# Patient Record
Sex: Female | Born: 1975 | Race: White | Hispanic: No | Marital: Married | Smoking: Never smoker
Health system: Southern US, Community
[De-identification: ages and names within clinical notes are randomized; demographics above are authoritative.]

## PROBLEM LIST (undated history)

## (undated) ENCOUNTER — Inpatient Hospital Stay (HOSPITAL_COMMUNITY): Payer: Self-pay

## (undated) DIAGNOSIS — N390 Urinary tract infection, site not specified: Secondary | ICD-10-CM

## (undated) DIAGNOSIS — N200 Calculus of kidney: Secondary | ICD-10-CM

## (undated) DIAGNOSIS — T8859XA Other complications of anesthesia, initial encounter: Secondary | ICD-10-CM

## (undated) DIAGNOSIS — I10 Essential (primary) hypertension: Secondary | ICD-10-CM

## (undated) DIAGNOSIS — O24419 Gestational diabetes mellitus in pregnancy, unspecified control: Secondary | ICD-10-CM

## (undated) DIAGNOSIS — O139 Gestational [pregnancy-induced] hypertension without significant proteinuria, unspecified trimester: Secondary | ICD-10-CM

## (undated) DIAGNOSIS — K219 Gastro-esophageal reflux disease without esophagitis: Secondary | ICD-10-CM

## (undated) DIAGNOSIS — E119 Type 2 diabetes mellitus without complications: Secondary | ICD-10-CM

## (undated) HISTORY — PX: CHOLECYSTECTOMY: SHX55

## (undated) HISTORY — PX: TONSILLECTOMY: SUR1361

## (undated) HISTORY — PX: KNEE ARTHROSCOPY: SUR90

---

## 1999-07-29 ENCOUNTER — Other Ambulatory Visit: Admission: RE | Admit: 1999-07-29 | Discharge: 1999-07-29 | Payer: Self-pay | Admitting: Gynecology

## 2000-08-28 ENCOUNTER — Other Ambulatory Visit: Admission: RE | Admit: 2000-08-28 | Discharge: 2000-08-28 | Payer: Self-pay | Admitting: Gynecology

## 2000-08-28 ENCOUNTER — Other Ambulatory Visit: Admission: RE | Admit: 2000-08-28 | Discharge: 2000-08-28 | Payer: Self-pay | Admitting: Obstetrics and Gynecology

## 2001-10-05 ENCOUNTER — Other Ambulatory Visit: Admission: RE | Admit: 2001-10-05 | Discharge: 2001-10-05 | Payer: Self-pay | Admitting: Obstetrics and Gynecology

## 2002-02-21 ENCOUNTER — Ambulatory Visit (HOSPITAL_COMMUNITY): Admission: RE | Admit: 2002-02-21 | Discharge: 2002-02-21 | Payer: Self-pay | Admitting: *Deleted

## 2002-10-12 ENCOUNTER — Other Ambulatory Visit: Admission: RE | Admit: 2002-10-12 | Discharge: 2002-10-12 | Payer: Self-pay | Admitting: Obstetrics and Gynecology

## 2002-11-12 ENCOUNTER — Emergency Department (HOSPITAL_COMMUNITY): Admission: EM | Admit: 2002-11-12 | Discharge: 2002-11-12 | Payer: Self-pay | Admitting: Emergency Medicine

## 2002-11-12 ENCOUNTER — Encounter: Payer: Self-pay | Admitting: Emergency Medicine

## 2002-11-18 ENCOUNTER — Encounter: Payer: Self-pay | Admitting: Internal Medicine

## 2002-11-18 ENCOUNTER — Ambulatory Visit (HOSPITAL_COMMUNITY): Admission: RE | Admit: 2002-11-18 | Discharge: 2002-11-18 | Payer: Self-pay | Admitting: Internal Medicine

## 2002-12-16 ENCOUNTER — Emergency Department (HOSPITAL_COMMUNITY): Admission: EM | Admit: 2002-12-16 | Discharge: 2002-12-16 | Payer: Self-pay | Admitting: Emergency Medicine

## 2003-03-01 ENCOUNTER — Ambulatory Visit (HOSPITAL_COMMUNITY): Admission: RE | Admit: 2003-03-01 | Discharge: 2003-03-01 | Payer: Self-pay | Admitting: Internal Medicine

## 2003-03-01 ENCOUNTER — Encounter: Payer: Self-pay | Admitting: Internal Medicine

## 2003-03-28 ENCOUNTER — Ambulatory Visit (HOSPITAL_COMMUNITY): Admission: RE | Admit: 2003-03-28 | Discharge: 2003-03-28 | Payer: Self-pay | Admitting: Family Medicine

## 2003-03-28 ENCOUNTER — Encounter: Payer: Self-pay | Admitting: Family Medicine

## 2003-08-10 ENCOUNTER — Emergency Department (HOSPITAL_COMMUNITY): Admission: EM | Admit: 2003-08-10 | Discharge: 2003-08-11 | Payer: Self-pay | Admitting: *Deleted

## 2003-11-28 ENCOUNTER — Inpatient Hospital Stay: Admission: AD | Admit: 2003-11-28 | Discharge: 2003-11-29 | Payer: Self-pay | Admitting: Obstetrics and Gynecology

## 2004-01-12 ENCOUNTER — Inpatient Hospital Stay (HOSPITAL_COMMUNITY): Admission: AD | Admit: 2004-01-12 | Discharge: 2004-01-14 | Payer: Self-pay | Admitting: Obstetrics and Gynecology

## 2004-02-22 ENCOUNTER — Other Ambulatory Visit: Admission: RE | Admit: 2004-02-22 | Discharge: 2004-02-22 | Payer: Self-pay | Admitting: Obstetrics and Gynecology

## 2004-07-12 ENCOUNTER — Ambulatory Visit (HOSPITAL_COMMUNITY): Admission: RE | Admit: 2004-07-12 | Discharge: 2004-07-12 | Payer: Self-pay | Admitting: Internal Medicine

## 2005-11-27 ENCOUNTER — Encounter (HOSPITAL_COMMUNITY): Admission: RE | Admit: 2005-11-27 | Discharge: 2005-12-27 | Payer: Self-pay | Admitting: Orthopedic Surgery

## 2005-12-30 ENCOUNTER — Encounter (HOSPITAL_COMMUNITY): Admission: RE | Admit: 2005-12-30 | Discharge: 2006-01-29 | Payer: Self-pay | Admitting: Orthopedic Surgery

## 2006-02-13 ENCOUNTER — Ambulatory Visit: Admission: RE | Admit: 2006-02-13 | Discharge: 2006-02-13 | Payer: Self-pay | Admitting: Orthopedic Surgery

## 2006-08-22 ENCOUNTER — Emergency Department (HOSPITAL_COMMUNITY): Admission: EM | Admit: 2006-08-22 | Discharge: 2006-08-22 | Payer: Self-pay | Admitting: Emergency Medicine

## 2006-10-22 ENCOUNTER — Ambulatory Visit (HOSPITAL_COMMUNITY): Admission: RE | Admit: 2006-10-22 | Discharge: 2006-10-22 | Payer: Self-pay | Admitting: Family Medicine

## 2007-03-13 ENCOUNTER — Emergency Department (HOSPITAL_COMMUNITY): Admission: EM | Admit: 2007-03-13 | Discharge: 2007-03-14 | Payer: Self-pay | Admitting: Emergency Medicine

## 2007-03-15 ENCOUNTER — Inpatient Hospital Stay (HOSPITAL_COMMUNITY): Admission: EM | Admit: 2007-03-15 | Discharge: 2007-03-16 | Payer: Self-pay | Admitting: Emergency Medicine

## 2007-03-19 ENCOUNTER — Ambulatory Visit (HOSPITAL_COMMUNITY): Admission: RE | Admit: 2007-03-19 | Discharge: 2007-03-19 | Payer: Self-pay | Admitting: Family Medicine

## 2007-05-25 ENCOUNTER — Ambulatory Visit (HOSPITAL_COMMUNITY): Admission: RE | Admit: 2007-05-25 | Discharge: 2007-05-25 | Payer: Self-pay | Admitting: Obstetrics and Gynecology

## 2007-12-04 ENCOUNTER — Inpatient Hospital Stay (HOSPITAL_COMMUNITY): Admission: AD | Admit: 2007-12-04 | Discharge: 2007-12-04 | Payer: Self-pay | Admitting: Obstetrics and Gynecology

## 2008-08-25 ENCOUNTER — Ambulatory Visit (HOSPITAL_COMMUNITY): Admission: RE | Admit: 2008-08-25 | Discharge: 2008-08-25 | Payer: Self-pay | Admitting: Family Medicine

## 2008-11-09 ENCOUNTER — Inpatient Hospital Stay (HOSPITAL_COMMUNITY): Admission: AD | Admit: 2008-11-09 | Discharge: 2008-11-09 | Payer: Self-pay | Admitting: Obstetrics and Gynecology

## 2008-11-13 ENCOUNTER — Inpatient Hospital Stay (HOSPITAL_COMMUNITY): Admission: AD | Admit: 2008-11-13 | Discharge: 2008-11-13 | Payer: Self-pay | Admitting: Obstetrics and Gynecology

## 2009-01-01 ENCOUNTER — Inpatient Hospital Stay (HOSPITAL_COMMUNITY): Admission: AD | Admit: 2009-01-01 | Discharge: 2009-01-04 | Payer: Self-pay | Admitting: Internal Medicine

## 2009-05-05 ENCOUNTER — Emergency Department (HOSPITAL_COMMUNITY): Admission: EM | Admit: 2009-05-05 | Discharge: 2009-05-06 | Payer: Self-pay | Admitting: Emergency Medicine

## 2010-01-16 IMAGING — US US EXTREM LOW VENOUS*R*
1 series · 14 of 24 positions shown · non-contrast
Comparison: None

CLINICAL DATA: Right upper extremity pain and tenderness, 20 weeks
pregnant

RIGHT UPPER EXTREMITY VENOUS DUPLEX ULTRASOUND
TECHNIQUE: Gray-scale sonography with graded compression, as well
as color Doppler and duplex ultrasound were performed to evaluate
the rightupper extremity deep venous system from the level of the
subclavian vein and including the jugular, axillary, basilic and
upper cephalic vein.  Spectral Doppler was utilized to evaluate
flow at rest and with distal augmentation maneuvers.

[Series 1: us extrem low venous*right* · 0.11mm/px · 14 of 38 slices shown]
[im 1/38]
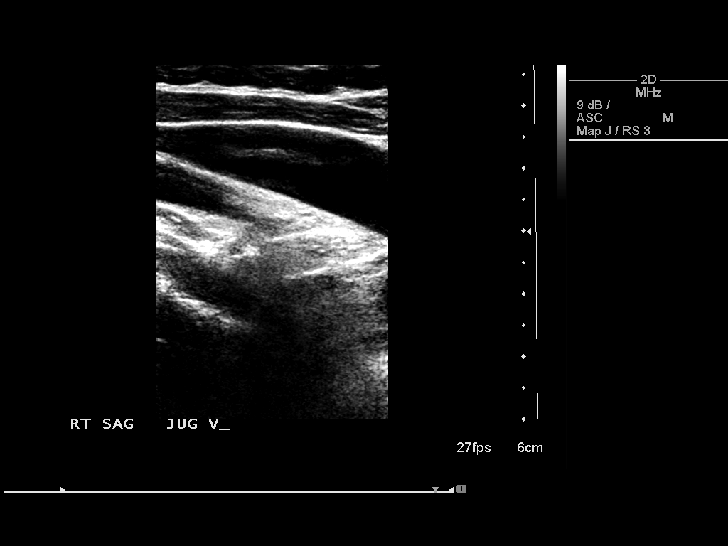
[im 4/38]
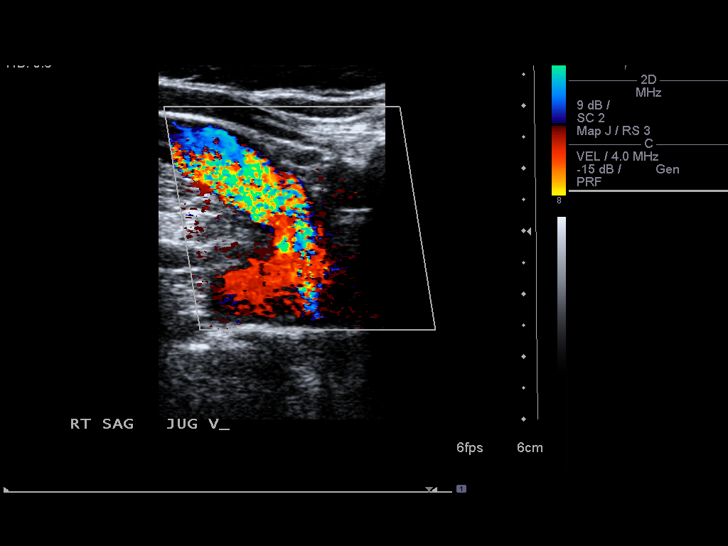
[im 7/38]
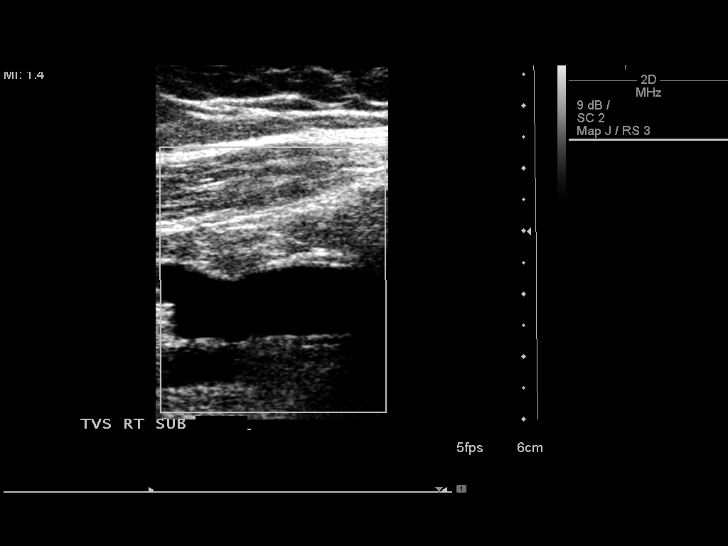
[im 10/38]
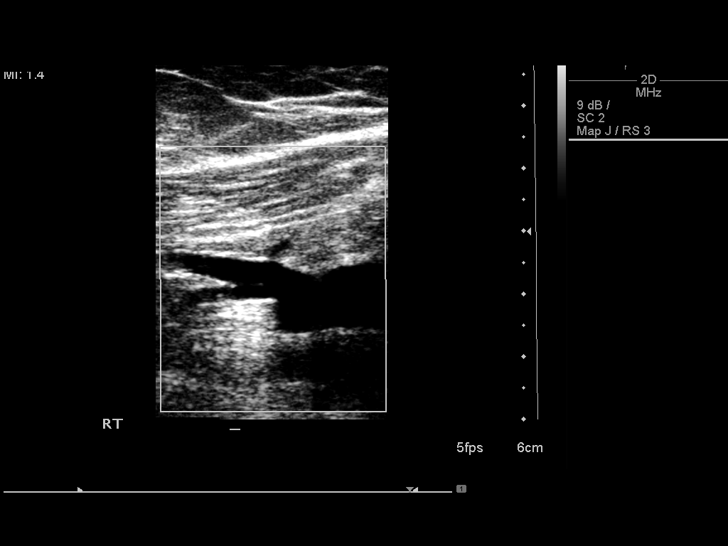
[im 12/38]
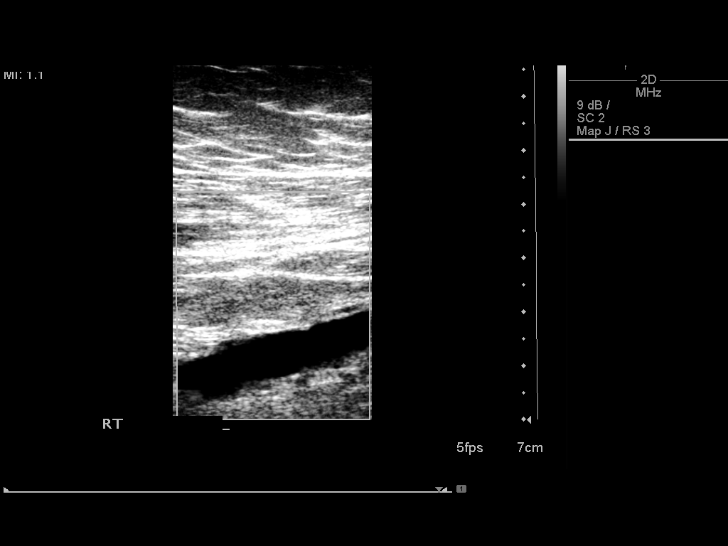
[im 15/38]
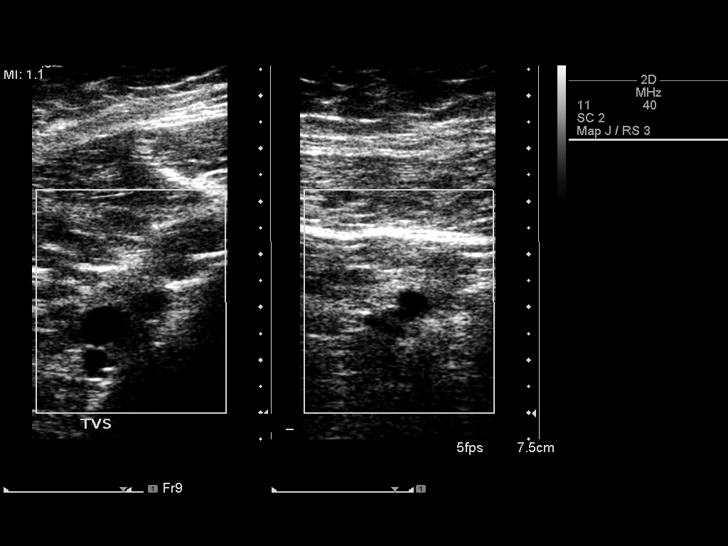
[im 18/38]
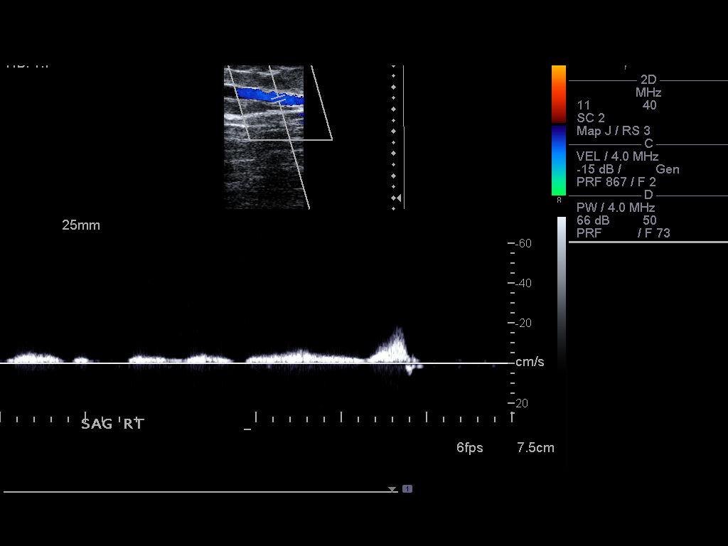
[im 20/38]
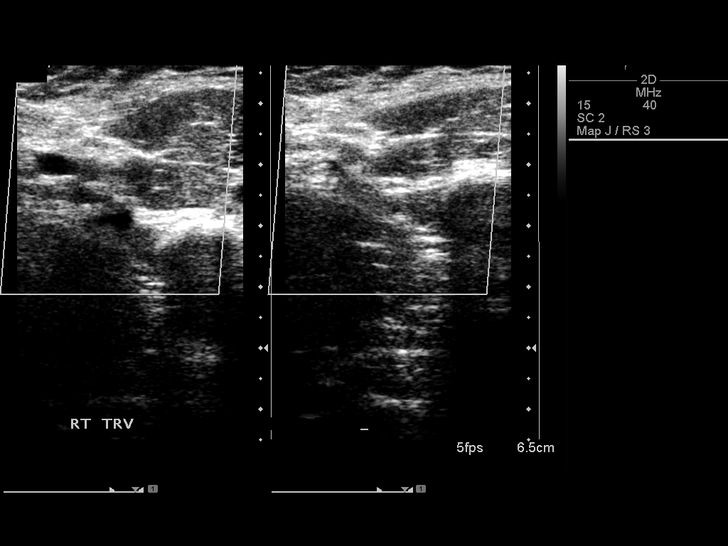
[im 23/38]
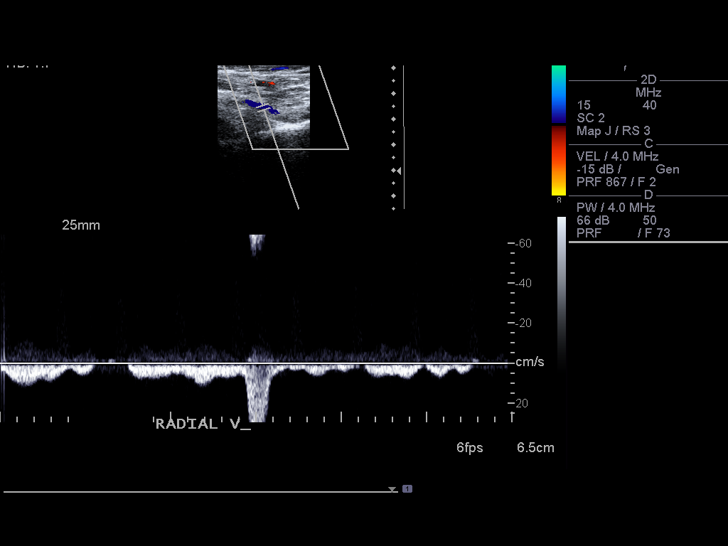
[im 26/38]
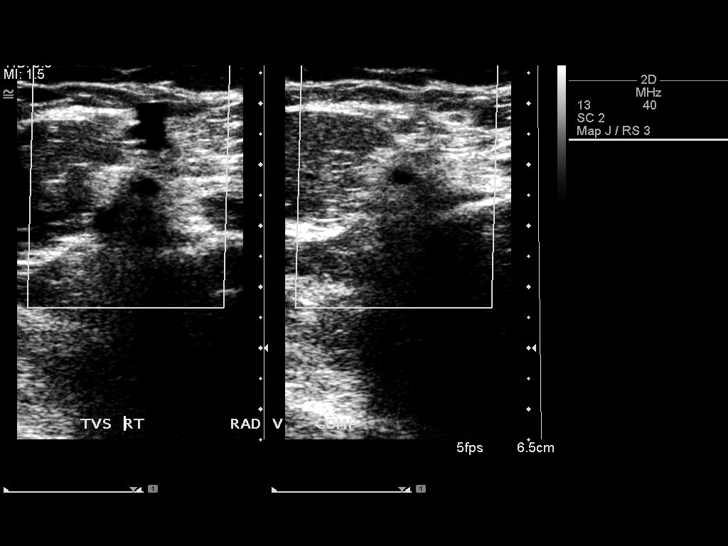
[im 29/38]
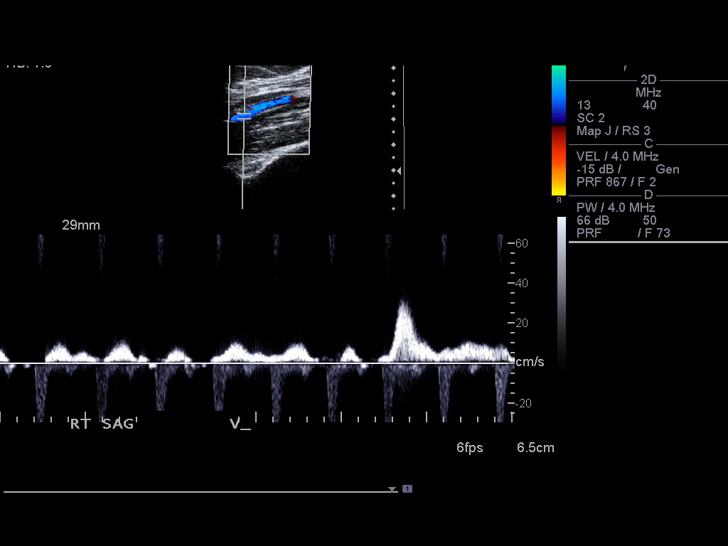
[im 31/38]
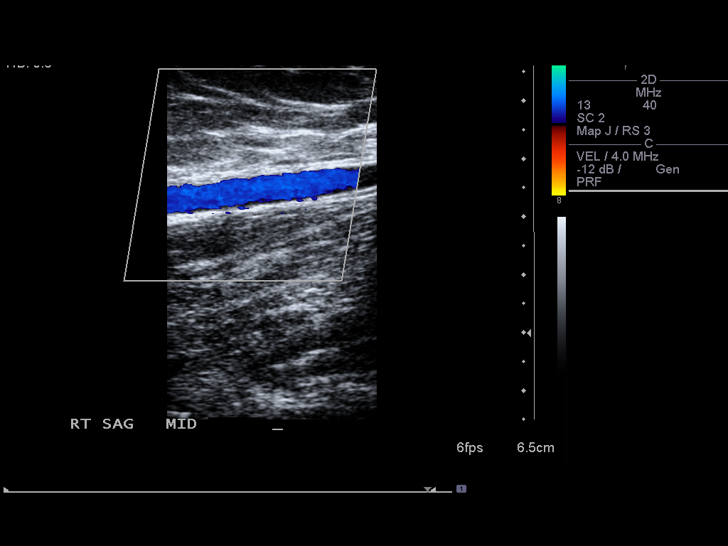
[im 34/38]
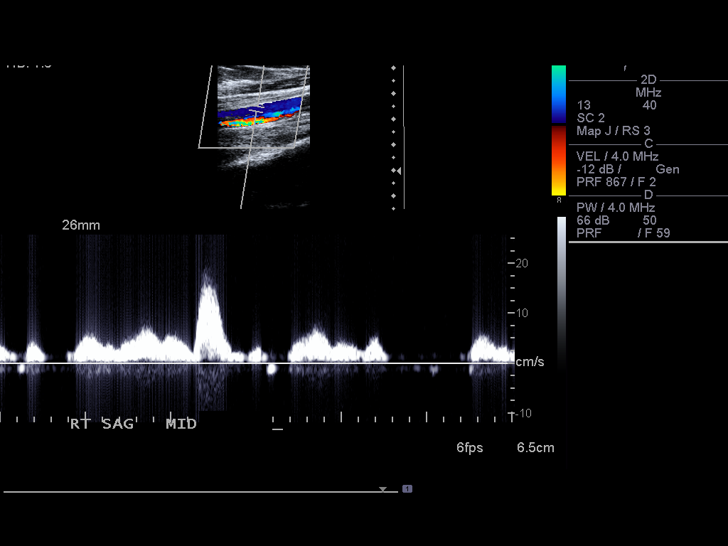
[im 38/38]
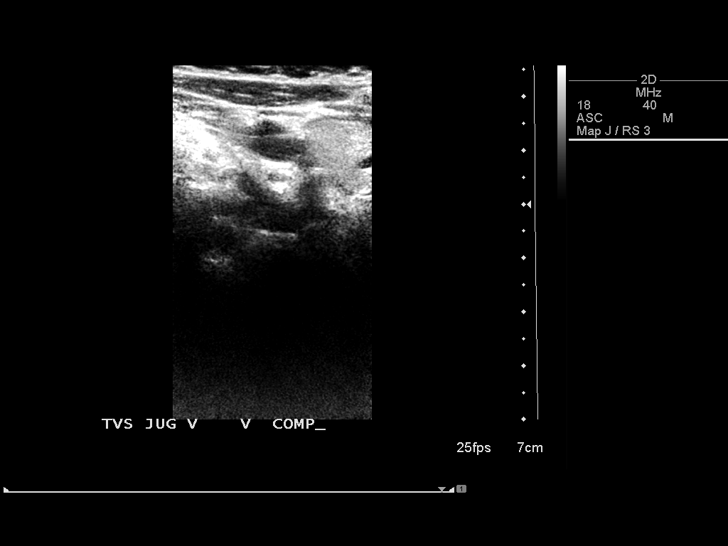

[14 of 24 positions shown; findings below may reference images not displayed]

FINDINGS: Deep venous system of right upper extremity appears patent and
compressible.
Spontaneous venous flow is present with intact augmentation and
respiratory phasicity.
Augmentation intact.
No intraluminal thrombus identified.
IMPRESSION: No evidence of deep venous thrombosis in right upper extremity.

## 2010-10-03 ENCOUNTER — Emergency Department (HOSPITAL_COMMUNITY): Admission: EM | Admit: 2010-10-03 | Discharge: 2010-08-15 | Payer: Self-pay | Admitting: Emergency Medicine

## 2010-11-17 ENCOUNTER — Encounter: Payer: Self-pay | Admitting: Family Medicine

## 2010-11-17 ENCOUNTER — Encounter: Payer: Self-pay | Admitting: Internal Medicine

## 2011-01-08 LAB — BASIC METABOLIC PANEL
Calcium: 9.1 mg/dL (ref 8.4–10.5)
Chloride: 104 mEq/L (ref 96–112)
Creatinine, Ser: 0.47 mg/dL (ref 0.4–1.2)
GFR calc Af Amer: >60 mL/min (ref >60)
Glucose, Bld: 104 mg/dL — ABNORMAL HIGH (ref 70–99)
Potassium: 3.6 mEq/L (ref 3.5–5.1)
Sodium: 136 mEq/L (ref 135–145)

## 2011-01-08 LAB — URINALYSIS, ROUTINE W REFLEX MICROSCOPIC
Leukocytes, UA: NEGATIVE
Nitrite: NEGATIVE
Specific Gravity, Urine: 1.015 (ref 1.005–1.030)
pH: 7 (ref 5.0–8.0)

## 2011-01-08 LAB — CBC
MCV: 87.8 fL (ref 78.0–100.0)
Platelets: 351 10*3/uL (ref 150–400)
RBC: 4.08 MIL/uL (ref 3.87–5.11)
RDW: 12.7 % (ref 11.5–15.5)
WBC: 10.4 10*3/uL (ref 4.0–10.5)

## 2011-01-08 LAB — DIFFERENTIAL
Basophils Absolute: 0.1 10*3/uL (ref 0.0–0.1)
Basophils Relative: 1 % (ref 0–1)
Eosinophils Absolute: 0.2 10*3/uL (ref 0.0–0.7)
Eosinophils Relative: 2 % (ref 0–5)
Neutrophils Relative %: 61 % (ref 43–77)

## 2011-01-08 LAB — URINE MICROSCOPIC-ADD ON

## 2011-01-08 LAB — POCT CARDIAC MARKERS: Myoglobin, poc: 44.1 ng/mL (ref 12–200)

## 2011-02-02 LAB — URINALYSIS, ROUTINE W REFLEX MICROSCOPIC
Glucose, UA: NEGATIVE mg/dL
Leukocytes, UA: NEGATIVE
Nitrite: NEGATIVE
Protein, ur: 100 mg/dL — AB
pH: 7 (ref 5.0–8.0)

## 2011-02-02 LAB — URINE CULTURE

## 2011-02-02 LAB — URINE MICROSCOPIC-ADD ON

## 2011-02-06 LAB — URINE MICROSCOPIC-ADD ON

## 2011-02-06 LAB — URINALYSIS, ROUTINE W REFLEX MICROSCOPIC
Bilirubin Urine: NEGATIVE
Hgb urine dipstick: NEGATIVE
Ketones, ur: NEGATIVE mg/dL
Specific Gravity, Urine: 1.01 (ref 1.005–1.030)

## 2011-02-06 LAB — CBC
MCHC: 33.8 g/dL (ref 30.0–36.0)
MCV: 86.8 fL (ref 78.0–100.0)
MCV: 88.1 fL (ref 78.0–100.0)
Platelets: 200 10*3/uL (ref 150–400)
Platelets: 231 10*3/uL (ref 150–400)
RBC: 4.04 MIL/uL (ref 3.87–5.11)
RDW: 14.1 % (ref 11.5–15.5)
WBC: 11.6 10*3/uL — ABNORMAL HIGH (ref 4.0–10.5)

## 2011-02-06 LAB — COMPREHENSIVE METABOLIC PANEL
ALT: 10 U/L (ref 0–35)
ALT: 15 U/L (ref 0–35)
AST: 19 U/L (ref 0–37)
AST: 20 U/L (ref 0–37)
Albumin: 1.9 g/dL — ABNORMAL LOW (ref 3.5–5.2)
Albumin: 2.4 g/dL — ABNORMAL LOW (ref 3.5–5.2)
Alkaline Phosphatase: 130 U/L — ABNORMAL HIGH (ref 39–117)
Calcium: 9.6 mg/dL (ref 8.4–10.5)
Chloride: 103 mEq/L (ref 96–112)
Chloride: 105 mEq/L (ref 96–112)
Creatinine, Ser: 0.44 mg/dL (ref 0.4–1.2)
Creatinine, Ser: 0.61 mg/dL (ref 0.4–1.2)
GFR calc Af Amer: >60 mL/min (ref >60)
Glucose, Bld: 85 mg/dL (ref 70–99)
Sodium: 134 mEq/L — ABNORMAL LOW (ref 135–145)
Sodium: 137 mEq/L (ref 135–145)
Total Bilirubin: 0.4 mg/dL (ref 0.3–1.2)

## 2011-02-06 LAB — URIC ACID: Uric Acid, Serum: 5.8 mg/dL (ref 2.4–7.0)

## 2011-02-06 LAB — GLUCOSE, CAPILLARY
Glucose-Capillary: 90 mg/dL (ref 70–99)
Glucose-Capillary: 94 mg/dL (ref 70–99)

## 2011-02-06 LAB — RPR: RPR Ser Ql: NONREACTIVE

## 2011-02-10 LAB — URINALYSIS, ROUTINE W REFLEX MICROSCOPIC
Nitrite: NEGATIVE
Specific Gravity, Urine: 1.02 (ref 1.005–1.030)
Urobilinogen, UA: 0.2 mg/dL (ref 0.0–1.0)

## 2011-03-11 NOTE — H&P (Signed)
NAME:  Courtney [name], Courtney [name] NO.:  [id number]   MEDICAL RECORD NO.:  [id number]          PATIENT TYPE:  MAT   LOCATION:  [facility]                          FACILITY:  [facility]   PHYSICIAN:  Duke Salvia. Marcelle Overlie, M.D.DATE OF BIRTH:  1975/11/21   DATE OF ADMISSION:  01/01/2009  DATE OF DISCHARGE:                              HISTORY & PHYSICAL   CHIEF COMPLAINT:  Preeclampsia at term.   HISTORY OF PRESENT ILLNESS:  A 35 year old G12, P1-0-2-1, EDD is January 11, 2009, presents for induction secondary to chronic hypertension with  superimposed preeclampsia.   This patient is blood pressure on booking was 130/84, not on any  medications, but has been 140/80 range in early pregnancy.  Normal  growth by ultrasound.  One 1-hour GTT was 98.  She has been on  gestational diabetes low carb diet with weekly reactive non-stress  tests.   At 31 weeks, she was noted to have a BP 140/90.  This has been followed  with frequent office visits and PIH labs which have been normal.  The  a.m. of admission, was noted to have 1+ protein, BP 152/96, complaining  of a headache, presents now for two-stage labor induction.  Cervix was  noted be 2, 50%, vertex with  NST reactive.  GBS screen was positive   PAST MEDICAL HISTORY:  Please see her health and history form for  details.  First pregnancy was 8 pounds 6 ounces, delivered vaginally in  2005.   PHYSICAL EXAMINATION:  VITAL SIGNS:  Temperature 98.2, blood pressure  150/96, 1+ proteinuria.  HEENT: Unremarkable.  NECK:  Supple without masses.  LUNGS:  Clear.  CARDIOVASCULAR:  Rate and rhythm without murmurs, rubs or gallops noted.  BREASTS:  Not examined.  ABDOMEN:  Term fundal height.  Fetal heart rate 140.  Cervix 2, 50%, vertex, and -2;  membranes intact.  EXTREMITIES:  One to 2+ lower extremity edema.  Reflexes 1 to 2+, no  clonus.   IMPRESSION:  1. A 38-1/2-week intrauterine pregnancy.  2. Gestational diabetes.  3. Chronic hypertension  with superimposed preeclampsia.   PLAN:  Admit for two-stage induction.      Richard M. Marcelle Overlie, M.D.  Electronically Signed     [facility]/MEDQ  D:  01/01/2009  T:  01/01/2009  Job:  [id number]

## 2011-03-11 NOTE — H&P (Signed)
NAMEVENORA, KAUTZMAN              ACCOUNT NO.:  [id number]   MEDICAL RECORD NO.:  [id number]          PATIENT TYPE:  INP   LOCATION:  A303                          FACILITY:  [facility]   PHYSICIAN:  Corrie Mckusick, M.D.  DATE OF BIRTH:  1975-11-25   DATE OF ADMISSION:  03/14/2007  DATE OF DISCHARGE:  [facility]                              HISTORY & PHYSICAL   ADMITTING DIAGNOSES:  1. Diarrhea.  2. Questionable urinary tract infection.   HISTORY OF PRESENTING ILLNESS:  This is a 35 year old with really no  significant past medical history.  She is status post cholecystectomy.  She presents with 2 days of diffuse watery diarrhea and nausea.  She has  had no frank vomiting.  There has been no blood in the stool.  She has  had some intermittent fevers.  She also has had some confusing achy  symptoms.  No urinary symptoms, no dysuria, frequency, urgency or other  complaints.  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 difficile on Friday.  I suspect that this is true/true  and unrelated.  She apparently has no other complaints, except for this  diffuse diarrhea, aches and periumbilical abdominal cramping.   She came in slightly dehydrated 2 days ago to the ER.  They hydrated her  and sent her home.  She came in last night and they decided to keep her  for hydration and further observation.   PAST MEDICAL/SURGICAL HISTORY:  Status post cholecystectomy.   FAMILY HISTORY:  Really noncontributory.   SOCIAL HISTORY:  Neither drinks nor smokes.   PHYSICAL EXAMINATION ON ADMISSION:  VITAL SIGNS:  Temperature 98.9,  blood pressure 126/77, pulse 87, respirations 20.  GENERAL:  When I saw her she was pleasant, talkative and in no acute  distress; feeling quite a bit better.  HEENT:  Negative.  Nasopharynx was clear, with now moist mucous  membranes after hydration.  NECK:  No lymphadenopathy.  CHEST:  Clear to auscultation bilaterally.  CARDIOVASCULAR:  Regular rate  and rhythm, no murmurs.  ABDOMEN:  Bowel sounds are hyperactive.  She has some periumbilical mild  pain with deep palpation.  There is no right upper quadrant pain.  There  is no other lower quadrant pain whatsoever.  No flank pain.  No  guarding.  No masses are appreciated.  EXTREMITIES:  No edema.   LABS:  C. difficile is pending.  Blood cultures are pending.  Urine  culture pending.  CBC:  White count 6.7, hemoglobin 10.6, hematocrit  30.2, platelets 345.  Sodium 136, potassium 3.5, chloride 106, bicarb  25, glucose 133, BUN 4, creatinine 0.56.  Again, C. difficile is  pending.  Urinalysis on admission showed leukocytes and bacteria.   We decided to go ahead and do a CT scan of the abdomen and pelvis.  There are a few upper normal size lymph nodes throughout the mesentery;  otherwise negative CT of the abdomen and pelvis.   ASSESSMENT AND PLAN:  A 35 year old female with no significant past  medical history, with probable viral gastroenteritis, questionable  urinary tract infection.   PLAN:  1. Admit for aggressive hydration with D5 half-normal saline with 10      of potassium at 125 an hour.  2. Cover with Cipro 400 mg IV q12 h.  3. Try to give her clear liquids this morning.  4. Lomotil 2 tablets t.i.d.  5. Will go ahead and cover with doxycycline 100 mg b.i.d., in the      event that this could be tick-borne illness.  I suspect this is      not; she has not found any ticks, but we will go ahead and cover      her for this due to symptomatology.  Will await cultures.      Corrie Mckusick, M.D.  Electronically Signed     JCG/MEDQ  D:  03/15/2007  T:  03/15/2007  Job:  [id number]

## 2011-03-11 NOTE — Discharge Summary (Signed)
NAME:  Courtney [name], Courtney [name] NO.:  [id number]   MEDICAL RECORD NO.:  [id number]          PATIENT TYPE:  INP   LOCATION:  A303                          FACILITY:  [facility]   PHYSICIAN:  Corrie Mckusick, M.D.  DATE OF BIRTH:  1975/12/07   DATE OF ADMISSION:  03/15/2007  DATE OF DISCHARGE:  05/20/2008LH                               DISCHARGE SUMMARY   History of presenting illness and past medical history, please see  admission H and P.   HOSPITAL COURSE:  This is a 35 year old female with no significant past  medical history although status post cholecystectomy, who presents with  2 to 3 days of diarrhea and fever.  She also had some nausea.  She was  exposed to C diff in the office which was negative on stool sample.  Luckily, by putting her in the hospital with aggressive IV fluid  hydration, she improved greatly.  The nausea and diarrhea went away.  She also was noted to be anemic, which she has restarted her menses, and  they have been relatively heavy this week.  She is also getting a workup  as an outpatient by Dr. Sherwood Gambler for the fatigue and anemia.   Iron was 33, which is low.  Percent saturation 12%, which was low as  well.  B12 236, low end of normal.  Hemoglobin was 10.6, hematocrit  30.2.  We decided to start her on iron once her GI symptoms seemed to  improve.  Urine culture is still pending by the time she was discharged.  We will continue her on Cipro.   DISCHARGE PHYSICAL:  Tmax of 98.9, blood pressure 120s/70s, heart rate  in the 60s, respiratory rate 20s.  When I saw her she was pleasant, talkative.  Looks quite well this  morning.  HEENT:  Negative.  Pharynx is clear.  CHEST:  Clear to auscultation bilaterally.  CARDIOVASCULAR:  Regular rate and rhythm.  No murmurs.  ABDOMEN:  Bowel sounds decreased, soft, nontender.   DISCHARGE MEDICATIONS:  Will start on in a week or so and Cipro 500  b.i.d. for an additional week, as well as Zofran 4 mg q.6 hours  p.r.n.  Follow up in 2 to 3 days for recheck, sooner if need be.   DISCHARGE CONDITION:  Improved to stable.      Corrie Mckusick, M.D.  Electronically Signed     JCG/MEDQ  D:  03/16/2007  T:  03/16/2007  Job:  [id number]

## 2011-08-11 LAB — ABO/RH: ABO/RH(D): AB POS

## 2011-11-27 ENCOUNTER — Other Ambulatory Visit (HOSPITAL_COMMUNITY): Payer: Self-pay | Admitting: Physician Assistant

## 2011-11-27 DIAGNOSIS — M5382 Other specified dorsopathies, cervical region: Secondary | ICD-10-CM

## 2011-11-28 ENCOUNTER — Ambulatory Visit (HOSPITAL_COMMUNITY)
Admission: RE | Admit: 2011-11-28 | Discharge: 2011-11-28 | Disposition: A | Discharge status: Home / Self Care | Payer: BC Managed Care – PPO | Source: Ambulatory Visit | Attending: Physician Assistant | Admitting: Physician Assistant

## 2011-11-28 DIAGNOSIS — M5382 Other specified dorsopathies, cervical region: Secondary | ICD-10-CM

## 2011-11-28 DIAGNOSIS — E049 Nontoxic goiter, unspecified: Secondary | ICD-10-CM | POA: Insufficient documentation

## 2011-11-28 DIAGNOSIS — M542 Cervicalgia: Secondary | ICD-10-CM | POA: Insufficient documentation

## 2012-10-24 ENCOUNTER — Emergency Department (HOSPITAL_COMMUNITY)
Admission: EM | Admit: 2012-10-24 | Discharge: 2012-10-24 | Discharge status: Against Medical Advice | Payer: BC Managed Care – PPO | Attending: Emergency Medicine | Admitting: Emergency Medicine

## 2012-10-24 DIAGNOSIS — Z5320 Procedure and treatment not carried out because of patient's decision for unspecified reasons: Secondary | ICD-10-CM | POA: Insufficient documentation

## 2012-10-24 NOTE — ED Notes (Signed)
Pt decided to leave during triage. 

## 2012-12-20 ENCOUNTER — Other Ambulatory Visit (HOSPITAL_COMMUNITY): Payer: Self-pay | Admitting: Family Medicine

## 2012-12-20 ENCOUNTER — Ambulatory Visit (HOSPITAL_COMMUNITY)
Admission: RE | Admit: 2012-12-20 | Discharge: 2012-12-20 | Disposition: A | Discharge status: Home / Self Care | Payer: BC Managed Care – PPO | Source: Ambulatory Visit | Attending: Family Medicine | Admitting: Family Medicine

## 2012-12-20 DIAGNOSIS — D34 Benign neoplasm of thyroid gland: Secondary | ICD-10-CM | POA: Insufficient documentation

## 2012-12-20 DIAGNOSIS — M546 Pain in thoracic spine: Secondary | ICD-10-CM | POA: Insufficient documentation

## 2012-12-20 DIAGNOSIS — R0602 Shortness of breath: Secondary | ICD-10-CM | POA: Insufficient documentation

## 2012-12-20 DIAGNOSIS — R079 Chest pain, unspecified: Secondary | ICD-10-CM | POA: Insufficient documentation

## 2013-06-27 ENCOUNTER — Encounter (HOSPITAL_COMMUNITY): Payer: Self-pay

## 2013-06-27 DIAGNOSIS — I10 Essential (primary) hypertension: Secondary | ICD-10-CM | POA: Insufficient documentation

## 2013-06-27 DIAGNOSIS — M549 Dorsalgia, unspecified: Secondary | ICD-10-CM | POA: Insufficient documentation

## 2013-06-27 DIAGNOSIS — N201 Calculus of ureter: Secondary | ICD-10-CM | POA: Insufficient documentation

## 2013-06-27 DIAGNOSIS — R110 Nausea: Secondary | ICD-10-CM | POA: Insufficient documentation

## 2013-06-27 DIAGNOSIS — Z79899 Other long term (current) drug therapy: Secondary | ICD-10-CM | POA: Insufficient documentation

## 2013-06-27 DIAGNOSIS — Z3202 Encounter for pregnancy test, result negative: Secondary | ICD-10-CM | POA: Insufficient documentation

## 2013-06-27 DIAGNOSIS — Z87440 Personal history of urinary (tract) infections: Secondary | ICD-10-CM | POA: Insufficient documentation

## 2013-06-27 NOTE — ED Notes (Signed)
Pt started having intermittent left flank pain since Wednesday, states is also having some burning with urination

## 2013-06-28 ENCOUNTER — Emergency Department (HOSPITAL_COMMUNITY): Payer: BC Managed Care – PPO

## 2013-06-28 ENCOUNTER — Emergency Department (HOSPITAL_COMMUNITY)
Admission: EM | Admit: 2013-06-28 | Discharge: 2013-06-28 | Disposition: A | Discharge status: Home / Self Care | Payer: BC Managed Care – PPO | Attending: Emergency Medicine | Admitting: Emergency Medicine

## 2013-06-28 DIAGNOSIS — N201 Calculus of ureter: Secondary | ICD-10-CM

## 2013-06-28 HISTORY — DX: Urinary tract infection, site not specified: N39.0

## 2013-06-28 HISTORY — DX: Essential (primary) hypertension: I10

## 2013-06-28 LAB — COMPREHENSIVE METABOLIC PANEL
ALT: 19 U/L (ref 0–35)
AST: 15 U/L (ref 0–37)
BUN: 12 mg/dL (ref 6–23)
GFR calc Af Amer: >90 mL/min (ref >90)
GFR calc non Af Amer: >90 mL/min (ref >90)
Potassium: 3.8 mEq/L (ref 3.5–5.1)
Sodium: 137 mEq/L (ref 135–145)
Total Bilirubin: 0.2 mg/dL — ABNORMAL LOW (ref 0.3–1.2)
Total Protein: 7.2 g/dL (ref 6.0–8.3)

## 2013-06-28 LAB — URINALYSIS, ROUTINE W REFLEX MICROSCOPIC
Ketones, ur: NEGATIVE mg/dL
Leukocytes, UA: NEGATIVE
Nitrite: NEGATIVE
Protein, ur: NEGATIVE mg/dL
Urobilinogen, UA: 0.2 mg/dL (ref 0.0–1.0)

## 2013-06-28 LAB — CBC WITH DIFFERENTIAL/PLATELET
Basophils Relative: 0 % (ref 0–1)
Eosinophils Absolute: 0.4 10*3/uL (ref 0.0–0.7)
MCH: 30.3 pg (ref 26.0–34.0)
MCHC: 34.4 g/dL (ref 30.0–36.0)
Monocytes Relative: 6 % (ref 3–12)
Neutrophils Relative %: 55 % (ref 43–77)
Platelets: 350 10*3/uL (ref 150–400)

## 2013-06-28 LAB — URINE MICROSCOPIC-ADD ON

## 2013-06-28 MED ORDER — SODIUM CHLORIDE 0.9 % IV SOLN: INTRAVENOUS | Status: DC

## 2013-06-28 MED ORDER — IBUPROFEN 800 MG PO TABS: 800.0000 mg | ORAL_TABLET | Freq: Three times a day (TID) | ORAL | Status: DC

## 2013-06-28 MED ORDER — HYDROCODONE-ACETAMINOPHEN 5-325 MG PO TABS: 1.0000 | ORAL_TABLET | Freq: Four times a day (QID) | ORAL | Status: DC | PRN

## 2013-06-28 MED ORDER — ONDANSETRON HCL 4 MG/2ML IJ SOLN
4.0000 mg | Freq: Once | INTRAMUSCULAR | Status: AC
  Administered 2013-06-28: 4 mg via INTRAVENOUS
  Filled 2013-06-28: qty 2

## 2013-06-28 MED ORDER — SODIUM CHLORIDE 0.9 % IV BOLUS (SEPSIS)
500.0000 mL | Freq: Once | INTRAVENOUS | Status: AC
  Administered 2013-06-28: 01:00:00 via INTRAVENOUS

## 2013-06-28 MED ORDER — HYDROMORPHONE HCL PF 1 MG/ML IJ SOLN
1.0000 mg | Freq: Once | INTRAMUSCULAR | Status: AC
  Administered 2013-06-28: 1 mg via INTRAVENOUS
  Filled 2013-06-28: qty 1

## 2013-06-28 NOTE — ED Provider Notes (Signed)
CSN: [id number]     Arrival date & time 06/27/13  2320 History   First MD Initiated Contact with Patient 06/28/13 0029     Chief Complaint  Patient presents with  . Flank Pain   (Consider location/radiation/quality/duration/timing/severity/associated sxs/prior Treatment) Patient is a 37 y.o. female presenting with flank pain. The history is provided by the patient.  Flank Pain Associated symptoms include abdominal pain. Pertinent negatives include no chest pain, no headaches and no shortness of breath.   patient presents with left lower corner abdominal pain present for about a week Kenyen Candy and worse in the last few days. Associated with the left-sided CVA back pain. Also associated with some mild dysuria. Pain is worse is 10 out of 10 associated with nausea no vomiting.  Past Medical History  Diagnosis Date  . Urinary tract infection   . Hypertension    Past Surgical History  Procedure Laterality Date  . Cholecystectomy    . Tonsillectomy    . Knee arthroscopy     No family history on file. History  Substance Use Topics  . Smoking status: Never Smoker   . Smokeless tobacco: Not on file  . Alcohol Use: No   OB History   Grav Para Term Preterm Abortions TAB SAB Ect Mult Living                 Review of Systems  Constitutional: Negative for fever.  HENT: Negative for congestion.   Eyes: Negative for redness.  Respiratory: Negative for shortness of breath.   Cardiovascular: Negative for chest pain.  Gastrointestinal: Positive for nausea and abdominal pain. Negative for vomiting and diarrhea.  Genitourinary: Positive for dysuria and flank pain. Negative for hematuria.  Musculoskeletal: Positive for back pain.  Skin: Negative for rash.  Neurological: Negative for headaches.  Hematological: Does not bruise/bleed easily.  Psychiatric/Behavioral: Negative for confusion.    Allergies  Review of patient's allergies indicates no known allergies.  Home Medications   Current  Outpatient Rx  Name  Route  Sig  Dispense  Refill  . dextromethorphan (DELSYM) 30 MG/5ML liquid   Oral   Take 60 mg by mouth as needed for cough.         Marland Kitchen lisinopril (PRINIVIL,ZESTRIL) 20 MG tablet   Oral   Take 20 mg by mouth daily.         Marland Kitchen HYDROcodone-acetaminophen (NORCO/VICODIN) 5-325 MG per tablet   Oral   Take 1-2 tablets by mouth every 6 (six) hours as needed for pain.   14 tablet   0   . ibuprofen (ADVIL,MOTRIN) 800 MG tablet   Oral   Take 1 tablet (800 mg total) by mouth 3 (three) times daily.   21 tablet   0    BP 135/81  Pulse 72  Temp(Src) 98.4 F (36.9 C) (Oral)  Resp 20  Ht 5' 3.5" (1.613 m)  Wt 226 lb (102.513 kg)  BMI 39.4 kg/m2  SpO2 100% Physical Exam  Nursing note and vitals reviewed. Constitutional: She appears well-developed and well-nourished. No distress.  HENT:  Head: Normocephalic and atraumatic.  Mouth/Throat: Oropharynx is clear and moist.  Eyes: Conjunctivae and EOM are normal. Pupils are equal, round, and reactive to light.  Neck: Normal range of motion.  Cardiovascular: Normal rate, regular rhythm and normal heart sounds.   No murmur heard. Pulmonary/Chest: Effort normal and breath sounds normal. No respiratory distress.  Abdominal: Soft. Bowel sounds are normal. There is no tenderness.  Musculoskeletal: Normal range of motion.  She exhibits no edema.  Neurological: She is alert. No cranial nerve deficit. She exhibits normal muscle tone. Coordination normal.  Skin: Skin is warm. No rash noted.    ED Course  Procedures (including critical care time) Labs Review Labs Reviewed  URINALYSIS, ROUTINE W REFLEX MICROSCOPIC - Abnormal; Notable for the following:    Hgb urine dipstick TRACE (*)    All other components within normal limits  CBC WITH DIFFERENTIAL - Abnormal; Notable for the following:    RBC 3.86 (*)    Hemoglobin 11.7 (*)    HCT 34.0 (*)    All other components within normal limits  COMPREHENSIVE METABOLIC PANEL -  Abnormal; Notable for the following:    Glucose, Bld 111 (*)    Albumin 3.4 (*)    Total Bilirubin 0.2 (*)    All other components within normal limits  URINE MICROSCOPIC-ADD ON - Abnormal; Notable for the following:    Squamous Epithelial / LPF FEW (*)    Bacteria, UA FEW (*)    All other components within normal limits  PREGNANCY, URINE    Results for orders placed during the hospital encounter of 06/28/13  URINALYSIS, ROUTINE W REFLEX MICROSCOPIC      Result Value Range   Color, Urine YELLOW  YELLOW   APPearance CLEAR  CLEAR   Specific Gravity, Urine 1.015  1.005 - 1.030   pH 6.0  5.0 - 8.0   Glucose, UA NEGATIVE  NEGATIVE mg/dL   Hgb urine dipstick TRACE (*) NEGATIVE   Bilirubin Urine NEGATIVE  NEGATIVE   Ketones, ur NEGATIVE  NEGATIVE mg/dL   Protein, ur NEGATIVE  NEGATIVE mg/dL   Urobilinogen, UA 0.2  0.0 - 1.0 mg/dL   Nitrite NEGATIVE  NEGATIVE   Leukocytes, UA NEGATIVE  NEGATIVE  PREGNANCY, URINE      Result Value Range   Preg Test, Ur NEGATIVE  NEGATIVE  CBC WITH DIFFERENTIAL      Result Value Range   WBC 9.1  4.0 - 10.5 K/uL   RBC 3.86 (*) 3.87 - 5.11 MIL/uL   Hemoglobin 11.7 (*) 12.0 - 15.0 g/dL   HCT 47.8 (*) 29.5 - 62.1 %   MCV 88.1  78.0 - 100.0 fL   MCH 30.3  26.0 - 34.0 pg   MCHC 34.4  30.0 - 36.0 g/dL   RDW 30.8  65.7 - 84.6 %   Platelets 350  150 - 400 K/uL   Neutrophils Relative % 55  43 - 77 %   Neutro Abs 5.1  1.7 - 7.7 K/uL   Lymphocytes Relative 34  12 - 46 %   Lymphs Abs 3.1  0.7 - 4.0 K/uL   Monocytes Relative 6  3 - 12 %   Monocytes Absolute 0.5  0.1 - 1.0 K/uL   Eosinophils Relative 5  0 - 5 %   Eosinophils Absolute 0.4  0.0 - 0.7 K/uL   Basophils Relative 0  0 - 1 %   Basophils Absolute 0.0  0.0 - 0.1 K/uL  COMPREHENSIVE METABOLIC PANEL      Result Value Range   Sodium 137  135 - 145 mEq/L   Potassium 3.8  3.5 - 5.1 mEq/L   Chloride 101  96 - 112 mEq/L   CO2 28  19 - 32 mEq/L   Glucose, Bld 111 (*) 70 - 99 mg/dL   BUN 12  6 - 23  mg/dL   Creatinine, Ser 9.62  0.50 - 1.10 mg/dL  Calcium 9.6  8.4 - 10.5 mg/dL   Total Protein 7.2  6.0 - 8.3 g/dL   Albumin 3.4 (*) 3.5 - 5.2 g/dL   AST 15  0 - 37 U/L   ALT 19  0 - 35 U/L   Alkaline Phosphatase 62  39 - 117 U/L   Total Bilirubin 0.2 (*) 0.3 - 1.2 mg/dL   GFR calc non Af Amer >90  >90 mL/min   GFR calc Af Amer >90  >90 mL/min  URINE MICROSCOPIC-ADD ON      Result Value Range   Squamous Epithelial / LPF FEW (*) RARE   WBC, UA 0-2  <3 WBC/hpf   RBC / HPF 0-2  <3 RBC/hpf   Bacteria, UA FEW (*) RARE     Imaging Review Ct Abdomen Pelvis Wo Contrast  06/28/2013   *RADIOLOGY REPORT*  Clinical Data:  Flank pain.  CT ABDOMEN AND PELVIS WITHOUT CONTRAST  Technique:  Multidetector CT imaging of the abdomen and pelvis was performed following the standard protocol without intravenous contrast.  Comparison: 03/15/2007.  Findings:  BODY WALL: Unremarkable.  LOWER CHEST:  Mediastinum: Unremarkable.  Lungs/pleura: No consolidation.  ABDOMEN/PELVIS:  Liver: No focal abnormality.  Biliary: Cholecystectomy, accounting for mild CBD enlargement.  Pancreas: Unremarkable.  Spleen: Unremarkable.  Adrenals: Unremarkable.  Kidneys and ureters: Moderate left hydroureteronephrosis secondary to a 5 mm stone at the left ureterovesicular junction.  Additional, punctate left nephrolithiasis in the lower pole.  Numerous stones in the lower pole right kidney, the largest measuring 6 mm.  No right hydronephrosis.  Bladder: Unremarkable.  Bowel: No obstruction. Normal appendix.  Retroperitoneum: No mass or adenopathy.  Peritoneum: No free fluid or gas.  Reproductive: IUD, unremarkable appearance.  Vascular: No acute abnormality.  OSSEOUS: No acute abnormalities. L4-5 disc bulging.  IMPRESSION:  1.  Left hydroureteronephrosis secondary to a 5 mm stone at the ureterovesicular junction. 2.  Bilateral nephrolithiasis, right more than left.   Original Report Authenticated By: Tiburcio Pea    MDM   1. Ureteral  stone    CT scan correlates with patient's clinical symptoms of left-sided abdominal pain shows a 5 mm stone at the ureterovesicular junction. No evidence of urinary tract infection no evidence of renal function abnormalities. Patient will be treated with the pain medication and anti-inflammatories followup with her primary care Dr. Patient improved in the emergency department with pain meds.    Shelda Jakes, MD 06/28/13 [id number]

## 2014-12-06 ENCOUNTER — Ambulatory Visit (HOSPITAL_COMMUNITY)
Admission: RE | Admit: 2014-12-06 | Discharge: 2014-12-06 | Disposition: A | Discharge status: Home / Self Care | Payer: BLUE CROSS/BLUE SHIELD | Source: Ambulatory Visit | Attending: Internal Medicine | Admitting: Internal Medicine

## 2014-12-06 ENCOUNTER — Other Ambulatory Visit (HOSPITAL_COMMUNITY): Payer: Self-pay | Admitting: Internal Medicine

## 2014-12-06 DIAGNOSIS — E049 Nontoxic goiter, unspecified: Secondary | ICD-10-CM

## 2014-12-06 DIAGNOSIS — R079 Chest pain, unspecified: Secondary | ICD-10-CM

## 2014-12-08 ENCOUNTER — Ambulatory Visit (HOSPITAL_COMMUNITY)
Admission: RE | Admit: 2014-12-08 | Discharge: 2014-12-08 | Disposition: A | Discharge status: Home / Self Care | Payer: BLUE CROSS/BLUE SHIELD | Source: Ambulatory Visit | Attending: Internal Medicine | Admitting: Internal Medicine

## 2014-12-08 DIAGNOSIS — E010 Iodine-deficiency related diffuse (endemic) goiter: Secondary | ICD-10-CM | POA: Insufficient documentation

## 2014-12-08 DIAGNOSIS — E049 Nontoxic goiter, unspecified: Secondary | ICD-10-CM

## 2015-03-09 ENCOUNTER — Other Ambulatory Visit (HOSPITAL_COMMUNITY): Payer: Self-pay | Admitting: Family Medicine

## 2015-03-09 DIAGNOSIS — R1909 Other intra-abdominal and pelvic swelling, mass and lump: Secondary | ICD-10-CM

## 2015-03-12 ENCOUNTER — Ambulatory Visit (HOSPITAL_COMMUNITY): Payer: BLUE CROSS/BLUE SHIELD

## 2015-06-18 ENCOUNTER — Emergency Department (HOSPITAL_COMMUNITY)
Admission: EM | Admit: 2015-06-18 | Discharge: 2015-06-18 | Disposition: A | Discharge status: Home / Self Care | Payer: BLUE CROSS/BLUE SHIELD | Attending: Emergency Medicine | Admitting: Emergency Medicine

## 2015-06-18 ENCOUNTER — Encounter (HOSPITAL_COMMUNITY): Payer: Self-pay | Admitting: Emergency Medicine

## 2015-06-18 ENCOUNTER — Emergency Department (HOSPITAL_COMMUNITY): Payer: BLUE CROSS/BLUE SHIELD

## 2015-06-18 DIAGNOSIS — Z87440 Personal history of urinary (tract) infections: Secondary | ICD-10-CM | POA: Insufficient documentation

## 2015-06-18 DIAGNOSIS — I10 Essential (primary) hypertension: Secondary | ICD-10-CM | POA: Diagnosis not present

## 2015-06-18 DIAGNOSIS — Z3202 Encounter for pregnancy test, result negative: Secondary | ICD-10-CM | POA: Diagnosis not present

## 2015-06-18 DIAGNOSIS — Z79899 Other long term (current) drug therapy: Secondary | ICD-10-CM | POA: Diagnosis not present

## 2015-06-18 DIAGNOSIS — R109 Unspecified abdominal pain: Secondary | ICD-10-CM | POA: Diagnosis present

## 2015-06-18 DIAGNOSIS — N23 Unspecified renal colic: Secondary | ICD-10-CM | POA: Diagnosis not present

## 2015-06-18 LAB — CBC WITH DIFFERENTIAL/PLATELET
BASOS ABS: 0 10*3/uL (ref 0.0–0.1)
Basophils Relative: 0 % (ref 0–1)
Eosinophils Absolute: 0.3 10*3/uL (ref 0.0–0.7)
Eosinophils Relative: 3 % (ref 0–5)
HEMATOCRIT: 33.3 % — AB (ref 36.0–46.0)
Hemoglobin: 11.1 g/dL — ABNORMAL LOW (ref 12.0–15.0)
LYMPHS PCT: 28 % (ref 12–46)
Lymphs Abs: 2.9 10*3/uL (ref 0.7–4.0)
MCH: 29.1 pg (ref 26.0–34.0)
MCHC: 33.3 g/dL (ref 30.0–36.0)
MCV: 87.2 fL (ref 78.0–100.0)
Monocytes Absolute: 0.7 10*3/uL (ref 0.1–1.0)
Monocytes Relative: 7 % (ref 3–12)
NEUTROS ABS: 6.3 10*3/uL (ref 1.7–7.7)
Neutrophils Relative %: 62 % (ref 43–77)
PLATELETS: 333 10*3/uL (ref 150–400)
RBC: 3.82 MIL/uL — ABNORMAL LOW (ref 3.87–5.11)
RDW: 13.4 % (ref 11.5–15.5)
WBC: 10.1 10*3/uL (ref 4.0–10.5)

## 2015-06-18 LAB — URINALYSIS, ROUTINE W REFLEX MICROSCOPIC
Bilirubin Urine: NEGATIVE
Glucose, UA: NEGATIVE mg/dL
Ketones, ur: NEGATIVE mg/dL
Leukocytes, UA: NEGATIVE
Nitrite: NEGATIVE
PH: 6 (ref 5.0–8.0)
PROTEIN: NEGATIVE mg/dL
SPECIFIC GRAVITY, URINE: 1.025 (ref 1.005–1.030)
Urobilinogen, UA: 0.2 mg/dL (ref 0.0–1.0)

## 2015-06-18 LAB — URINE MICROSCOPIC-ADD ON

## 2015-06-18 LAB — COMPREHENSIVE METABOLIC PANEL
ALT: 50 U/L (ref 14–54)
AST: 38 U/L (ref 15–41)
Albumin: 3.5 g/dL (ref 3.5–5.0)
Alkaline Phosphatase: 59 U/L (ref 38–126)
Anion gap: 6 (ref 5–15)
BUN: 14 mg/dL (ref 6–20)
CHLORIDE: 104 mmol/L (ref 101–111)
CO2: 25 mmol/L (ref 22–32)
CREATININE: 0.52 mg/dL (ref 0.44–1.00)
Calcium: 8.4 mg/dL — ABNORMAL LOW (ref 8.9–10.3)
GFR calc Af Amer: >60 mL/min (ref >60)
Glucose, Bld: 117 mg/dL — ABNORMAL HIGH (ref 65–99)
Potassium: 3.4 mmol/L — ABNORMAL LOW (ref 3.5–5.1)
Sodium: 135 mmol/L (ref 135–145)
Total Bilirubin: 0.6 mg/dL (ref 0.3–1.2)
Total Protein: 7.4 g/dL (ref 6.5–8.1)

## 2015-06-18 LAB — PREGNANCY, URINE: Preg Test, Ur: NEGATIVE

## 2015-06-18 MED ORDER — ONDANSETRON HCL 4 MG/2ML IJ SOLN
4.0000 mg | Freq: Once | INTRAMUSCULAR | Status: AC
  Administered 2015-06-18: 4 mg via INTRAVENOUS
  Filled 2015-06-18: qty 2

## 2015-06-18 MED ORDER — HYDROCODONE-ACETAMINOPHEN 5-325 MG PO TABS: 1.0000 | ORAL_TABLET | Freq: Four times a day (QID) | ORAL | Status: DC | PRN

## 2015-06-18 MED ORDER — HYDROMORPHONE HCL 1 MG/ML IJ SOLN
1.0000 mg | Freq: Once | INTRAMUSCULAR | Status: AC
Start: 2015-06-18 — End: 2015-06-18
  Administered 2015-06-18: 1 mg via INTRAVENOUS
  Filled 2015-06-18: qty 1

## 2015-06-18 MED ORDER — HYDROMORPHONE HCL 1 MG/ML IJ SOLN
1.0000 mg | Freq: Once | INTRAMUSCULAR | Status: AC
  Administered 2015-06-18: 1 mg via INTRAVENOUS
  Filled 2015-06-18: qty 1

## 2015-06-18 MED ORDER — KETOROLAC TROMETHAMINE 30 MG/ML IJ SOLN
INTRAMUSCULAR | Status: AC
  Filled 2015-06-18: qty 1

## 2015-06-18 MED ORDER — ONDANSETRON HCL 4 MG PO TABS: 4.0000 mg | ORAL_TABLET | Freq: Three times a day (TID) | ORAL | Status: DC | PRN

## 2015-06-18 MED ORDER — SODIUM CHLORIDE 0.9 % IV BOLUS (SEPSIS)
500.0000 mL | Freq: Once | INTRAVENOUS | Status: AC
  Administered 2015-06-18: 500 mL via INTRAVENOUS

## 2015-06-18 MED ORDER — KETOROLAC TROMETHAMINE 30 MG/ML IJ SOLN
30.0000 mg | Freq: Once | INTRAMUSCULAR | Status: AC
  Administered 2015-06-18: 30 mg via INTRAVENOUS

## 2015-06-18 MED ORDER — IBUPROFEN 600 MG PO TABS: 600.0000 mg | ORAL_TABLET | Freq: Four times a day (QID) | ORAL | Status: DC | PRN

## 2015-06-18 MED ORDER — OXYCODONE-ACETAMINOPHEN 5-325 MG PO TABS: 1.0000 | ORAL_TABLET | ORAL | Status: DC | PRN

## 2015-06-18 MED ORDER — TAMSULOSIN HCL 0.4 MG PO CAPS: 0.4000 mg | ORAL_CAPSULE | Freq: Every day | ORAL | Status: DC

## 2015-06-18 NOTE — ED Notes (Signed)
Patient states woke up this morning with right flank pain radiating around to lower abdomen. Also reports frequent urination and feels as if she is not emptying her bladder.

## 2015-06-18 NOTE — Discharge Instructions (Signed)

## 2015-06-18 NOTE — ED Provider Notes (Signed)
CSN: [id number]     Arrival date & time 06/18/15  0154 History   First MD Initiated Contact with Patient 06/18/15 0204     Chief Complaint  Patient presents with  . Flank Pain     (Consider location/radiation/quality/duration/timing/severity/associated sxs/prior Treatment) HPI Patient has a history of renal stones and presents with acute onset right flank pain radiating to her right lower abdomen. She states symptoms are exactly the same as previous renal stone diagnosed in 2014. She denies any gross hematuria, nausea or vomiting. She's had frequent urination but feels she is not emptying her bladder completely. She's had no fever or chills. Past Medical History  Diagnosis Date  . Urinary tract infection   . Hypertension    Past Surgical History  Procedure Laterality Date  . Cholecystectomy    . Tonsillectomy    . Knee arthroscopy     History reviewed. No pertinent family history. Social History  Substance Use Topics  . Smoking status: Never Smoker   . Smokeless tobacco: None  . Alcohol Use: No   OB History    No data available     Review of Systems  Constitutional: Negative for fever and chills.  Gastrointestinal: Positive for abdominal pain. Negative for nausea and vomiting.  Genitourinary: Positive for frequency and flank pain. Negative for dysuria, hematuria, vaginal bleeding, vaginal discharge and pelvic pain.  Neurological: Negative for dizziness, weakness, light-headedness and numbness.  All other systems reviewed and are negative.     Allergies  Review of patient's allergies indicates no known allergies.  Home Medications   Prior to Admission medications   Medication Sig Start Date End Date Taking? Authorizing Provider  lisinopril (PRINIVIL,ZESTRIL) 20 MG tablet Take 20 mg by mouth daily.   Yes Historical Provider, MD  pantoprazole (PROTONIX) 40 MG tablet Take 40 mg by mouth daily.   Yes Historical Provider, MD  dextromethorphan (DELSYM) 30 MG/5ML liquid  Take 60 mg by mouth as needed for cough.    Historical Provider, MD  HYDROcodone-acetaminophen (NORCO/VICODIN) 5-325 MG per tablet Take 1-2 tablets by mouth every 6 (six) hours as needed. 06/18/15   Julianne Rice, MD  ibuprofen (ADVIL,MOTRIN) 600 MG tablet Take 1 tablet (600 mg total) by mouth every 6 (six) hours as needed for moderate pain. 06/18/15   Julianne Rice, MD  ondansetron (ZOFRAN) 4 MG tablet Take 1 tablet (4 mg total) by mouth every 8 (eight) hours as needed for nausea or vomiting. 06/18/15   Julianne Rice, MD  oxyCODONE-acetaminophen (PERCOCET) 5-325 MG per tablet Take 1-2 tablets by mouth every 4 (four) hours as needed for severe pain. 06/18/15   Julianne Rice, MD  tamsulosin (FLOMAX) 0.4 MG CAPS capsule Take 1 capsule (0.4 mg total) by mouth daily. 06/18/15   Julianne Rice, MD   BP 113/55 mmHg  Pulse 86  Temp(Src) 98 F (36.7 C) (Oral)  Resp 20  Ht 5\' 3"  (1.6 m)  Wt 230 lb (104.327 kg)  BMI 40.75 kg/m2  SpO2 98%  LMP 06/08/2015 Physical Exam  Constitutional: She is oriented to person, place, and time. She appears well-developed and well-nourished. She appears distressed.  Patient is standing bent over in pain.  HENT:  Head: Normocephalic and atraumatic.  Mouth/Throat: Oropharynx is clear and moist.  Eyes: EOM are normal. Pupils are equal, round, and reactive to light.  Neck: Normal range of motion. Neck supple.  Cardiovascular: Normal rate and regular rhythm.   Pulmonary/Chest: Effort normal and breath sounds normal. No respiratory distress. She has  no wheezes. She has no rales.  Abdominal: Soft. Bowel sounds are normal. She exhibits no distension and no mass. There is no tenderness. There is no rebound and no guarding.  Musculoskeletal: Normal range of motion. She exhibits tenderness. She exhibits no edema.  Right CVA tenderness to percussion.  Neurological: She is alert and oriented to person, place, and time.  Skin: Skin is warm and dry. No rash noted. No  erythema.  Psychiatric: She has a normal mood and affect. Her behavior is normal.  Nursing note and vitals reviewed.   ED Course  Procedures (including critical care time) Labs Review Labs Reviewed  CBC WITH DIFFERENTIAL/PLATELET - Abnormal; Notable for the following:    RBC 3.82 (*)    Hemoglobin 11.1 (*)    HCT 33.3 (*)    All other components within normal limits  COMPREHENSIVE METABOLIC PANEL - Abnormal; Notable for the following:    Potassium 3.4 (*)    Glucose, Bld 117 (*)    Calcium 8.4 (*)    All other components within normal limits  URINALYSIS, ROUTINE W REFLEX MICROSCOPIC (NOT AT [facility]) - Abnormal; Notable for the following:    Hgb urine dipstick LARGE (*)    All other components within normal limits  URINE MICROSCOPIC-ADD ON - Abnormal; Notable for the following:    Bacteria, UA MANY (*)    All other components within normal limits  PREGNANCY, URINE    Imaging Review Ct Abdomen Pelvis Wo Contrast  06/18/2015   CLINICAL DATA:  Awoke this morning with sharp constant right flank pain radiating to lower abdomen. Frequent urination.  EXAM: CT ABDOMEN AND PELVIS WITHOUT CONTRAST  TECHNIQUE: Multidetector CT imaging of the abdomen and pelvis was performed following the standard protocol without IV contrast.  COMPARISON:  CT 06/28/2013  FINDINGS: Minimal atelectasis in the dependent right lower lobe. The included lung bases are otherwise clear.  Obstructing 7 mm stone at the right ureteropelvic junction with resultant hydronephrosis. No significant perinephric stranding. The ureter is decompressed. Additional small punctate nonobstructing stones in the lower right kidney. Punctate nonobstructing stone in the upper left kidney, left ureter is decompressed.  Clips in the gallbladder fossa from cholecystectomy. Unchanged prominence of the common bile duct, a common finding postcholecystectomy. No focal hepatic lesion. The spleen, adrenal glands, and pancreas are normal.  Stomach is  physiologically distended. There are no dilated or thickened bowel loops. The appendix is normal. Small to moderate volume of colonic stool without colonic wall thickening. No free air, free fluid, or intra-abdominal fluid collection.  Abdominal aorta is normal in caliber. Multiple small retroperitoneal lymph nodes without pathologic adenopathy.  Urinary bladder is minimally distended. There are bilateral pelvic phleboliths. The uterus is normal for age. The ovaries are symmetric in size. No adnexal mass. No pelvic free fluid.  There are no acute or suspicious osseous abnormalities.  IMPRESSION: 1. Obstructing 7 mm stone at the right ureteropelvic junction with resultant mild hydronephrosis. 2. Small nonobstructing stones in both kidneys.   Electronically Signed   By: Jeb Levering M.D.   On: 06/18/2015 04:03   I have personally reviewed and evaluated these images and lab results as part of my medical decision-making.   EKG Interpretation None      MDM   Final diagnoses:  Renal colic on right side    History and exam consistent with renal stone. Get CT to evaluate for the size and position of the stone.  7 mm stone with mild hydronephrosis. Patient's  symptoms are controlled. We'll discharge home to follow-up with urology. Patient's been given return precautions and has voiced standing.  Julianne Rice, MD 06/18/15 [phone]

## 2015-06-19 ENCOUNTER — Other Ambulatory Visit: Payer: Self-pay | Admitting: Urology

## 2015-06-20 ENCOUNTER — Encounter (HOSPITAL_COMMUNITY): Payer: Self-pay | Admitting: *Deleted

## 2015-06-20 NOTE — H&P (Signed)
Active Problems Problems  1. Calculus of right ureter (N20.1) 2. Microscopic hematuria (R31.2)  History of Present Illness Courtney [name] is a 39 yo WF who had the onset at 1 am of severe right flank pain with nausea and vomiting. She had some urgency on Sunday but no hematuria. She had one prior stone that she passed in 05/2013. She has no other GU history other than a history of a cystoscopy for microhematuria.   Past Medical History Problems  1. History of esophageal reflux (Z87.19) 2. History of hypertension (Z86.79) 3. History of urinary tract infection (Z87.440)  Surgical History Problems  1. History of Arthroscopy Knee 2. History of Cholecystectomy 3. History of Gynecologic Services Intrauterine Device (IUD) Removal 4. History of Tonsillectomy  Current Meds 1. Hydrocodone-Acetaminophen 5-325 MG Oral Tablet;  Therapy: (Recorded:22Aug2016) to Recorded 2. Lisinopril 20 MG Oral Tablet;  Therapy: (Recorded:22Aug2016) to Recorded 3. Ondansetron HCl - 4 MG Oral Tablet;  Therapy: (Recorded:22Aug2016) to Recorded 4. Oxycodone-Acetaminophen 5-325 MG Oral Tablet;  Therapy: (Recorded:22Aug2016) to Recorded 5. Pantoprazole Sodium 20 MG Oral Tablet Delayed Release;  Therapy: (Recorded:22Aug2016) to Recorded  Allergies Medication  1. No Known Drug Allergies  Family History Problems  1. No pertinent family history : Mother, Father  Social History Problems    Denied: History of Alcohol use   Caffeine use (F15.90)   4 per day   Married   Never a smoker   Number of children   2 sons   Occupation   store clerk  G2P2 NVD   Review of Systems Genitourinary, constitutional, skin, eye, otolaryngeal, hematologic/lymphatic, cardiovascular, pulmonary, endocrine, musculoskeletal, gastrointestinal, neurological and psychiatric system(s) were reviewed and pertinent findings if present are noted and are otherwise negative.  Genitourinary: incontinence (with coughing and straining  since her last child was born. ).  Gastrointestinal: nausea, vomiting and flank pain.  Constitutional: fever.  Musculoskeletal: back pain.    Vitals Vital Signs [Data Includes: Last 1 Day]  Recorded: 22Aug2016 04:26PM  Height: 5 ft 3 in Weight: 230 lb  BMI Calculated: 40.74 BSA Calculated: 2.05 Blood Pressure: 123 / 82 Temperature: 98.1 F Heart Rate: 69  Physical Exam Constitutional: Well nourished and well developed . No acute distress.  ENT:. The ears and nose are normal in appearance.  Neck: The appearance of the neck is normal and no neck mass is present.  Pulmonary: No respiratory distress and normal respiratory rhythm and effort.  Cardiovascular: Heart rate and rhythm are normal . No peripheral edema.  Abdomen: The abdomen is obese. No masses are palpated. Mild tenderness in the RUQ is present. mild right CVA tenderness. No hepatosplenomegaly noted.  Lymphatics: The posterior cervical and supraclavicular nodes are not enlarged or tender.  Skin: Normal skin turgor, no visible rash and no visible skin lesions.  Neuro/Psych:. Mood and affect are appropriate.    Results/Data Urine [Data Includes: Last 1 Day]   22Aug2016  COLOR YELLOW   APPEARANCE CLEAR   SPECIFIC GRAVITY 1.025   pH 5.5   GLUCOSE NEGATIVE   BILIRUBIN NEGATIVE   KETONE NEGATIVE   BLOOD 3+   PROTEIN NEGATIVE   NITRITE NEGATIVE   LEUKOCYTE ESTERASE NEGATIVE   SQUAMOUS EPITHELIAL/HPF 6-10 HPF  WBC NONE SEEN WBC/HPF  RBC 20-40 RBC/HPF  BACTERIA MANY HPF  CRYSTALS NONE SEEN HPF  CASTS NONE SEEN LPF  Yeast NONE SEEN HPF   Old records or history reviewed: ER records reviewed.  The following images/tracing/specimen were independently visualized:  CT films reviewed. She has a 7mm   stone with 1300HU density.  The following clinical lab reports were reviewed:  UA reviewed.    Assessment Assessed  1. Calculus of right ureter (N20.1) 2. Microscopic hematuria (R31.2)  She has a 25m right UPJ stone with  symptomatic obstruction.   Plan Calculus of right ureter  1. Follow-up Schedule Surgery Office  Follow-up  Status: Hold For - Appointment   Requested for: 22Aug2016 Health Maintenance  2. UA With REFLEX; [Do Not Release]; Status:Resulted - Requires Verification;   Done:  22Aug2016 04:11PM Microscopic hematuria  3. URINE CULTURE; Status:Hold For - Specimen/Data Collection,Appointment; Requested  for:22Aug2016;  Unlinked  4. Stop: Delsym 30 MG/5ML Oral Suspension Extended Release  Urine culture today.  I have reviewed her options including MET, ureteroscopy and ESWL.  She would like to schedule ESWL on Thursday.   I reviewed the risks of bleeding, infection, injury to the kidney or adjacent organs, need for secondary procedures, failure of fragmentation or obstructing fragments, thrombotic events and sedation complications.   Discussion/Summary CC: Dr. LRedmond School

## 2015-06-21 ENCOUNTER — Ambulatory Visit (HOSPITAL_COMMUNITY)
Admission: RE | Admit: 2015-06-21 | Discharge: 2015-06-21 | Disposition: A | Discharge status: Home / Self Care | Payer: BLUE CROSS/BLUE SHIELD | Source: Ambulatory Visit | Attending: Urology | Admitting: Urology

## 2015-06-21 ENCOUNTER — Encounter (HOSPITAL_COMMUNITY): Payer: Self-pay | Admitting: *Deleted

## 2015-06-21 ENCOUNTER — Ambulatory Visit (HOSPITAL_COMMUNITY): Payer: BLUE CROSS/BLUE SHIELD

## 2015-06-21 ENCOUNTER — Encounter (HOSPITAL_COMMUNITY)
Admission: RE | Disposition: A | Discharge status: Home / Self Care | Payer: Self-pay | Source: Ambulatory Visit | Attending: Urology

## 2015-06-21 DIAGNOSIS — Z79891 Long term (current) use of opiate analgesic: Secondary | ICD-10-CM | POA: Diagnosis not present

## 2015-06-21 DIAGNOSIS — R109 Unspecified abdominal pain: Secondary | ICD-10-CM | POA: Diagnosis present

## 2015-06-21 DIAGNOSIS — N201 Calculus of ureter: Secondary | ICD-10-CM

## 2015-06-21 DIAGNOSIS — Z79899 Other long term (current) drug therapy: Secondary | ICD-10-CM | POA: Diagnosis not present

## 2015-06-21 DIAGNOSIS — Z87442 Personal history of urinary calculi: Secondary | ICD-10-CM | POA: Diagnosis not present

## 2015-06-21 DIAGNOSIS — K219 Gastro-esophageal reflux disease without esophagitis: Secondary | ICD-10-CM | POA: Insufficient documentation

## 2015-06-21 DIAGNOSIS — I10 Essential (primary) hypertension: Secondary | ICD-10-CM | POA: Insufficient documentation

## 2015-06-21 LAB — PREGNANCY, URINE: PREG TEST UR: NEGATIVE

## 2015-06-21 SURGERY — LITHOTRIPSY, ESWL
Anesthesia: LOCAL | Laterality: Right

## 2015-06-21 MED ORDER — CIPROFLOXACIN HCL 500 MG PO TABS
500.0000 mg | ORAL_TABLET | ORAL | Status: AC
  Administered 2015-06-21: 500 mg via ORAL
  Filled 2015-06-21: qty 1

## 2015-06-21 MED ORDER — SODIUM CHLORIDE 0.9 % IV SOLN
INTRAVENOUS | Status: DC
  Administered 2015-06-21: 13:00:00 via INTRAVENOUS

## 2015-06-21 MED ORDER — FENTANYL CITRATE (PF) 100 MCG/2ML IJ SOLN: 25.0000 ug | INTRAMUSCULAR | Status: DC | PRN

## 2015-06-21 MED ORDER — SODIUM CHLORIDE 0.9 % IJ SOLN: 3.0000 mL | Freq: Two times a day (BID) | INTRAMUSCULAR | Status: DC

## 2015-06-21 MED ORDER — ACETAMINOPHEN 325 MG PO TABS: 650.0000 mg | ORAL_TABLET | ORAL | Status: DC | PRN

## 2015-06-21 MED ORDER — ACETAMINOPHEN 650 MG RE SUPP
650.0000 mg | RECTAL | Status: DC | PRN
  Filled 2015-06-21: qty 1

## 2015-06-21 MED ORDER — DIPHENHYDRAMINE HCL 25 MG PO CAPS
25.0000 mg | ORAL_CAPSULE | ORAL | Status: AC
  Administered 2015-06-21: 25 mg via ORAL
  Filled 2015-06-21: qty 1

## 2015-06-21 MED ORDER — DIAZEPAM 5 MG PO TABS
10.0000 mg | ORAL_TABLET | ORAL | Status: AC
  Administered 2015-06-21: 10 mg via ORAL
  Filled 2015-06-21: qty 2

## 2015-06-21 MED ORDER — OXYCODONE-ACETAMINOPHEN 5-325 MG PO TABS: 1.0000 | ORAL_TABLET | ORAL | Status: DC | PRN

## 2015-06-21 MED ORDER — SODIUM CHLORIDE 0.9 % IV SOLN: 250.0000 mL | INTRAVENOUS | Status: DC | PRN

## 2015-06-21 MED ORDER — CIPROFLOXACIN HCL 500 MG PO TABS: 500.0000 mg | ORAL_TABLET | Freq: Two times a day (BID) | ORAL | Status: DC

## 2015-06-21 MED ORDER — OXYCODONE HCL 5 MG PO TABS: 5.0000 mg | ORAL_TABLET | ORAL | Status: DC | PRN

## 2015-06-21 MED ORDER — SODIUM CHLORIDE 0.9 % IJ SOLN: 3.0000 mL | INTRAMUSCULAR | Status: DC | PRN

## 2015-06-21 NOTE — Discharge Instructions (Signed)

## 2015-06-21 NOTE — Interval H&P Note (Signed)
History and Physical Interval Note:  Her culture didn't get sent but she has had no UTI symptoms or fever and she had no WBC on her UA which was nit - and I felt the bacteria were most consistent with vaginal contaminant.   I will send her out with a few extra days of cipro as a precaution.    06/21/2015 1:58 PM  Courtney [name]  has presented today for surgery, with the diagnosis of right UPJ stone  The various methods of treatment have been discussed with the patient and family. After consideration of risks, benefits and other options for treatment, the patient has consented to  Procedure(s): RIGHT EXTRACORPOREAL SHOCK WAVE LITHOTRIPSY (ESWL) (Right) as a surgical intervention .  The patient's history has been reviewed, patient examined, no change in status, stable for surgery.  I have reviewed the patient's chart and labs.  Questions were answered to the patient's satisfaction.     Lonnie Reth J

## 2015-06-27 MED FILL — Hydrocodone-Acetaminophen Tab 5-325 MG: ORAL | Qty: 6 | Status: AC

## 2015-07-06 ENCOUNTER — Ambulatory Visit: Payer: BLUE CROSS/BLUE SHIELD | Admitting: Urology

## 2015-09-26 ENCOUNTER — Emergency Department (HOSPITAL_COMMUNITY): Payer: BLUE CROSS/BLUE SHIELD

## 2015-09-26 ENCOUNTER — Encounter (HOSPITAL_COMMUNITY): Payer: Self-pay | Admitting: Emergency Medicine

## 2015-09-26 ENCOUNTER — Emergency Department (HOSPITAL_COMMUNITY)
Admission: EM | Admit: 2015-09-26 | Discharge: 2015-09-26 | Disposition: A | Discharge status: Home / Self Care | Payer: BLUE CROSS/BLUE SHIELD | Attending: Emergency Medicine | Admitting: Emergency Medicine

## 2015-09-26 DIAGNOSIS — Z792 Long term (current) use of antibiotics: Secondary | ICD-10-CM | POA: Insufficient documentation

## 2015-09-26 DIAGNOSIS — Z79899 Other long term (current) drug therapy: Secondary | ICD-10-CM | POA: Insufficient documentation

## 2015-09-26 DIAGNOSIS — I10 Essential (primary) hypertension: Secondary | ICD-10-CM | POA: Insufficient documentation

## 2015-09-26 DIAGNOSIS — R0789 Other chest pain: Secondary | ICD-10-CM

## 2015-09-26 DIAGNOSIS — M546 Pain in thoracic spine: Secondary | ICD-10-CM | POA: Insufficient documentation

## 2015-09-26 DIAGNOSIS — Z87440 Personal history of urinary (tract) infections: Secondary | ICD-10-CM | POA: Diagnosis not present

## 2015-09-26 DIAGNOSIS — M549 Dorsalgia, unspecified: Secondary | ICD-10-CM

## 2015-09-26 LAB — CBC
HEMATOCRIT: 35 % — AB (ref 36.0–46.0)
Hemoglobin: 11.4 g/dL — ABNORMAL LOW (ref 12.0–15.0)
MCH: 28.5 pg (ref 26.0–34.0)
MCHC: 32.6 g/dL (ref 30.0–36.0)
MCV: 87.5 fL (ref 78.0–100.0)
PLATELETS: 350 10*3/uL (ref 150–400)
RBC: 4 MIL/uL (ref 3.87–5.11)
RDW: 13.4 % (ref 11.5–15.5)
WBC: 8.1 10*3/uL (ref 4.0–10.5)

## 2015-09-26 LAB — I-STAT TROPONIN, ED: TROPONIN I, POC: 0 ng/mL (ref 0.00–0.08)

## 2015-09-26 LAB — BASIC METABOLIC PANEL
Anion gap: 7 (ref 5–15)
BUN: 6 mg/dL (ref 6–20)
CALCIUM: 8.8 mg/dL — AB (ref 8.9–10.3)
CO2: 26 mmol/L (ref 22–32)
CREATININE: 0.55 mg/dL (ref 0.44–1.00)
Chloride: 104 mmol/L (ref 101–111)
GFR calc Af Amer: >60 mL/min (ref >60)
GLUCOSE: 97 mg/dL (ref 65–99)
Potassium: 4 mmol/L (ref 3.5–5.1)
SODIUM: 137 mmol/L (ref 135–145)

## 2015-09-26 NOTE — ED Provider Notes (Signed)
CSN: [id number]     Arrival date & time 09/26/15  1612 History   First MD Initiated Contact with Patient 09/26/15 1902     Chief Complaint  Patient presents with  . Chest Pain  . Back Pain     (Consider location/radiation/quality/duration/timing/severity/associated sxs/prior Treatment) Patient is a 39 y.o. female presenting with chest pain and back pain. The history is provided by the patient.  Chest Pain Associated symptoms: back pain   Associated symptoms: no abdominal pain, no fever, no headache, no palpitations, no shortness of breath and not vomiting   Back Pain Associated symptoms: chest pain   Associated symptoms: no abdominal pain, no fever and no headaches   Patient c/o pain in back for the past week.  Symptoms persistent, at rest, dull, mild, non radiating. No recent injury or strain. No radicular pain. States also felt intermittent indigestion/chest discomfort, also in past week. Hx gerd. Symptoms at rest. Not related to activity or exertion. No associated sob, unusual doe, nv, or diaphoresis. No leg pain or swelling. No recent surgery, immobility, trauma, or travel. No hx dvt or pe. No fam hx premature cad. Father, a smoker, had bypass at age 42.  No pleuritic pain. No abd pain. Hx cholecystectomy. Normal appetite. No wt change.  Denies cough or uri c/o. No fever or chills.      Past Medical History  Diagnosis Date  . Urinary tract infection   . Hypertension    Past Surgical History  Procedure Laterality Date  . Cholecystectomy    . Tonsillectomy    . Knee arthroscopy     No family history on file. Social History  Substance Use Topics  . Smoking status: Never Smoker   . Smokeless tobacco: None  . Alcohol Use: No   OB History    No data available     Review of Systems  Constitutional: Negative for fever and chills.  HENT: Negative for sore throat.   Eyes: Negative for redness.  Respiratory: Negative for shortness of breath.   Cardiovascular: Positive for  chest pain. Negative for palpitations and leg swelling.  Gastrointestinal: Negative for vomiting, abdominal pain and diarrhea.  Genitourinary: Negative for flank pain.  Musculoskeletal: Positive for back pain. Negative for neck pain.  Skin: Negative for rash.  Neurological: Negative for headaches.  Hematological: Does not bruise/bleed easily.  Psychiatric/Behavioral: Negative for confusion.      Allergies  Review of patient's allergies indicates no known allergies.  Home Medications   Prior to Admission medications   Medication Sig Start Date End Date Taking? Authorizing Provider  bisacodyl (DULCOLAX) 5 MG EC tablet Take 5 mg by mouth daily as needed for moderate constipation.    Historical Provider, MD  ciprofloxacin (CIPRO) 500 MG tablet Take 1 tablet (500 mg total) by mouth 2 (two) times daily. 06/21/15   Irine Seal, MD  HYDROcodone-acetaminophen (NORCO/VICODIN) 5-325 MG per tablet Take 1-2 tablets by mouth every 6 (six) hours as needed. 06/18/15   Julianne Rice, MD  ibuprofen (ADVIL,MOTRIN) 600 MG tablet Take 1 tablet (600 mg total) by mouth every 6 (six) hours as needed for moderate pain. Patient not taking: Reported on 06/20/2015 06/18/15   Julianne Rice, MD  lisinopril (PRINIVIL,ZESTRIL) 20 MG tablet Take 20 mg by mouth at bedtime.     Historical Provider, MD  ondansetron (ZOFRAN) 4 MG tablet Take 1 tablet (4 mg total) by mouth every 8 (eight) hours as needed for nausea or vomiting. 06/18/15   Julianne Rice, MD  oxyCODONE-acetaminophen (PERCOCET) 5-325 MG per tablet Take 1 tablet by mouth every 4 (four) hours as needed for severe pain. 06/21/15   Irine Seal, MD  pantoprazole (PROTONIX) 40 MG tablet Take 40 mg by mouth daily.    Historical Provider, MD  tamsulosin (FLOMAX) 0.4 MG CAPS capsule Take 1 capsule (0.4 mg total) by mouth daily. 06/18/15   Julianne Rice, MD   BP 141/81 mmHg  Pulse 68  Temp(Src) 97.9 F (36.6 C) (Oral)  Resp 16  SpO2 97%  LMP 08/28/2015 Physical  Exam  Constitutional: She is oriented to person, place, and time. She appears well-developed and well-nourished. No distress.  HENT:  Mouth/Throat: Oropharynx is clear and moist.  Eyes: Conjunctivae are normal. No scleral icterus.  Neck: Neck supple. No tracheal deviation present.  Cardiovascular: Normal rate, regular rhythm, normal heart sounds and intact distal pulses.  Exam reveals no gallop and no friction rub.   No murmur heard. Pulmonary/Chest: Effort normal and breath sounds normal. No respiratory distress. She exhibits no tenderness.  Abdominal: Soft. Normal appearance and bowel sounds are normal. She exhibits no distension and no mass. There is no tenderness. There is no rebound and no guarding.  Genitourinary:  No cva tenderness.   Musculoskeletal: She exhibits no edema or tenderness.  Neurological: She is alert and oriented to person, place, and time.  Skin: Skin is warm and dry. No rash noted.  Psychiatric: She has a normal mood and affect.  Nursing note and vitals reviewed.   ED Course  Procedures (including critical care time) Labs Review   Results for orders placed or performed during the hospital encounter of [id number]  Basic metabolic panel  Result Value Ref Range   Sodium 137 135 - 145 mmol/L   Potassium 4.0 3.5 - 5.1 mmol/L   Chloride 104 101 - 111 mmol/L   CO2 26 22 - 32 mmol/L   Glucose, Bld 97 65 - 99 mg/dL   BUN 6 6 - 20 mg/dL   Creatinine, Ser 0.55 0.44 - 1.00 mg/dL   Calcium 8.8 (L) 8.9 - 10.3 mg/dL   GFR calc non Af Amer >60 >60 mL/min   GFR calc Af Amer >60 >60 mL/min   Anion gap 7 5 - 15  CBC  Result Value Ref Range   WBC 8.1 4.0 - 10.5 K/uL   RBC 4.00 3.87 - 5.11 MIL/uL   Hemoglobin 11.4 (L) 12.0 - 15.0 g/dL   HCT 35.0 (L) 36.0 - 46.0 %   MCV 87.5 78.0 - 100.0 fL   MCH 28.5 26.0 - 34.0 pg   MCHC 32.6 30.0 - 36.0 g/dL   RDW 13.4 11.5 - 15.5 %   Platelets 350 150 - 400 K/uL  I-stat troponin, ED (not at [facility], [facility])  Result Value Ref Range    Troponin i, poc 0.00 0.00 - 0.08 ng/mL   Comment 3           Dg Chest 2 View  09/26/2015  CLINICAL DATA:  Chest heaviness EXAM: CHEST  2 VIEW COMPARISON:  12/06/2014 FINDINGS: The heart size and mediastinal contours are within normal limits. Both lungs are clear. The visualized skeletal structures are unremarkable. IMPRESSION: No active cardiopulmonary disease. Electronically Signed   By: Marybelle Killings M.D.   On: 09/26/2015 17:05      I have personally reviewed and evaluated these images and lab results as part of my medical decision-making.   EKG Interpretation   Date/Time:  Wednesday September 26 2015 16:19:28 EST  Ventricular Rate:  71 PR Interval:  156 QRS Duration: 82 QT Interval:  396 QTC Calculation: 430 R Axis:   64 Text Interpretation:  Normal sinus rhythm Normal ECG No significant change  since last tracing Confirmed by Ashok Cordia  MD, Lennette Bihari ([id number]) on 09/26/2015  7:03:55 PM      MDM    Labs.  Reviewed nursing notes and prior charts for additional history.   Labs unremarkable.   Pt currently appears stable for d/c.  rec protonix, gi meds if gerd symptoms. For back pain, motrin or aleve. Close pcp f/u.       Lajean Saver, MD 09/26/15 [phone]

## 2015-09-26 NOTE — Discharge Instructions (Signed)
It was our pleasure to provide your ER care today - we hope that you feel better.  Your test results look good.  Continue protonix.  If reflux symptoms, consider also trying maalox, pepcid, or gas-x as need for symptom relief.  For back pain, you may try motrin or aleve as need.  Follow up with primary care doctor in the coming week for recheck.  Return to ER if worse, new symptoms, trouble breathing, fevers, persistent/recurrent chest pain, medical emergency, other concern.      Back Pain, Adult Back pain is very common in adults.The cause of back pain is rarely dangerous and the pain often gets better over time.The cause of your back pain may not be known. Some common causes of back pain include:  Strain of the muscles or ligaments supporting the spine.  Wear and tear (degeneration) of the spinal disks.  Arthritis.  Direct injury to the back. For many people, back pain may return. Since back pain is rarely dangerous, most people can learn to manage this condition on their own. HOME CARE INSTRUCTIONS Watch your back pain for any changes. The following actions may help to lessen any discomfort you are feeling:  Remain active. It is stressful on your back to sit or stand in one place for long periods of time. Do not sit, drive, or stand in one place for more than 30 minutes at a time. Take short walks on even surfaces as soon as you are able.Try to increase the length of time you walk each day.  Exercise regularly as directed by your health care provider. Exercise helps your back heal faster. It also helps avoid future injury by keeping your muscles strong and flexible.  Do not stay in bed.Resting more than 1-2 days can delay your recovery.  Pay attention to your body when you bend and lift. The most comfortable positions are those that put less stress on your recovering back. Always use proper lifting techniques, including:  Bending your knees.  Keeping the load close to  your body.  Avoiding twisting.  Find a comfortable position to sleep. Use a firm mattress and lie on your side with your knees slightly bent. If you lie on your back, put a pillow under your knees.  Avoid feeling anxious or stressed.Stress increases muscle tension and can worsen back pain.It is important to recognize when you are anxious or stressed and learn ways to manage it, such as with exercise.  Take medicines only as directed by your health care provider. Over-the-counter medicines to reduce pain and inflammation are often the most helpful.Your health care provider may prescribe muscle relaxant drugs.These medicines help dull your pain so you can more quickly return to your normal activities and healthy exercise.  Apply ice to the injured area:  Put ice in a plastic bag.  Place a towel between your skin and the bag.  Leave the ice on for 20 minutes, 2-3 times a day for the first 2-3 days. After that, ice and heat may be alternated to reduce pain and spasms.  Maintain a healthy weight. Excess weight puts extra stress on your back and makes it difficult to maintain good posture. SEEK MEDICAL CARE IF:  You have pain that is not relieved with rest or medicine.  You have increasing pain going down into the legs or buttocks.  You have pain that does not improve in one week.  You have night pain.  You lose weight.  You have a fever or chills.  SEEK IMMEDIATE MEDICAL CARE IF:   You develop new bowel or bladder control problems.  You have unusual weakness or numbness in your arms or legs.  You develop nausea or vomiting.  You develop abdominal pain.  You feel faint.   This information is not intended to replace advice given to you by your health care provider. Make sure you discuss any questions you have with your health care provider.   Document Released: 10/13/2005 Document Revised: 11/03/2014 Document Reviewed: 02/14/2014 Elsevier Interactive Patient Education 2016  [employer]    Nonspecific Chest Pain  Chest pain can be caused by many different conditions. There is always a chance that your pain could be related to something serious, such as a heart attack or a blood clot in your lungs. Chest pain can also be caused by conditions that are not life-threatening. If you have chest pain, it is very important to follow up with your health care provider. CAUSES  Chest pain can be caused by:  Heartburn.  Pneumonia or bronchitis.  Anxiety or stress.  Inflammation around your heart (pericarditis) or lung (pleuritis or pleurisy).  A blood clot in your lung.  A collapsed lung (pneumothorax). It can develop suddenly on its own (spontaneous pneumothorax) or from trauma to the chest.  Shingles infection (varicella-zoster virus).  Heart attack.  Damage to the bones, muscles, and cartilage that make up your chest wall. This can include:  Bruised bones due to injury.  Strained muscles or cartilage due to frequent or repeated coughing or overwork.  Fracture to one or more ribs.  Sore cartilage due to inflammation (costochondritis). RISK FACTORS  Risk factors for chest pain may include:  Activities that increase your risk for trauma or injury to your chest.  Respiratory infections or conditions that cause frequent coughing.  Medical conditions or overeating that can cause heartburn.  Heart disease or family history of heart disease.  Conditions or health behaviors that increase your risk of developing a blood clot.  Having had chicken pox (varicella zoster). SIGNS AND SYMPTOMS Chest pain can feel like:  Burning or tingling on the surface of your chest or deep in your chest.  Crushing, pressure, aching, or squeezing pain.  Dull or sharp pain that is worse when you move, cough, or take a deep breath.  Pain that is also felt in your back, neck, shoulder, or arm, or pain that spreads to any of these areas. Your chest pain may come and go,  or it may stay constant. DIAGNOSIS Lab tests or other studies may be needed to find the cause of your pain. Your health care provider may have you take a test called an ambulatory ECG (electrocardiogram). An ECG records your heartbeat patterns at the time the test is performed. You may also have other tests, such as:  Transthoracic echocardiogram (TTE). During echocardiography, sound waves are used to create a picture of all of the heart structures and to look at how blood flows through your heart.  Transesophageal echocardiogram (TEE).This is a more advanced imaging test that obtains images from inside your body. It allows your health care provider to see your heart in finer detail.  Cardiac monitoring. This allows your health care provider to monitor your heart rate and rhythm in real time.  Holter monitor. This is a portable device that records your heartbeat and can help to diagnose abnormal heartbeats. It allows your health care provider to track your heart activity for several days, if needed.  Stress tests. These  can be done through exercise or by taking medicine that makes your heart beat more quickly.  Blood tests.  Imaging tests. TREATMENT  Your treatment depends on what is causing your chest pain. Treatment may include:  Medicines. These may include:  Acid blockers for heartburn.  Anti-inflammatory medicine.  Pain medicine for inflammatory conditions.  Antibiotic medicine, if an infection is present.  Medicines to dissolve blood clots.  Medicines to treat coronary artery disease.  Supportive care for conditions that do not require medicines. This may include:  Resting.  Applying heat or cold packs to injured areas.  Limiting activities until pain decreases. HOME CARE INSTRUCTIONS  If you were prescribed an antibiotic medicine, finish it all even if you start to feel better.  Avoid any activities that bring on chest pain.  Do not use any tobacco products,  including cigarettes, chewing tobacco, or electronic cigarettes. If you need help quitting, ask your health care provider.  Do not drink alcohol.  Take medicines only as directed by your health care provider.  Keep all follow-up visits as directed by your health care provider. This is important. This includes any further testing if your chest pain does not go away.  If heartburn is the cause for your chest pain, you may be told to keep your head raised (elevated) while sleeping. This reduces the chance that acid will go from your stomach into your esophagus.  Make lifestyle changes as directed by your health care provider. These may include:  Getting regular exercise. Ask your health care provider to suggest some activities that are safe for you.  Eating a heart-healthy diet. A registered dietitian can help you to learn healthy eating options.  Maintaining a healthy weight.  Managing diabetes, if necessary.  Reducing stress. SEEK MEDICAL CARE IF:  Your chest pain does not go away after treatment.  You have a rash with blisters on your chest.  You have a fever. SEEK IMMEDIATE MEDICAL CARE IF:   Your chest pain is worse.  You have an increasing cough, or you cough up blood.  You have severe abdominal pain.  You have severe weakness.  You faint.  You have chills.  You have sudden, unexplained chest discomfort.  You have sudden, unexplained discomfort in your arms, back, neck, or jaw.  You have shortness of breath at any time.  You suddenly start to sweat, or your skin gets clammy.  You feel nauseous or you vomit.  You suddenly feel light-headed or dizzy.  Your heart begins to beat quickly, or it feels like it is skipping beats. These symptoms may represent a serious problem that is an emergency. Do not wait to see if the symptoms will go away. Get medical help right away. Call your local emergency services (911 in the [place] Do not drive yourself to the hospital.    This information is not intended to replace advice given to you by your health care provider. Make sure you discuss any questions you have with your health care provider.   Document Released: 07/23/2005 Document Revised: 11/03/2014 Document Reviewed: 05/19/2014 Elsevier Interactive Patient Education 2016 [place].   Gastroesophageal Reflux Disease, Adult Normally, food travels down the esophagus and stays in the stomach to be digested. However, when a person has gastroesophageal reflux disease (GERD), food and stomach acid move back up into the esophagus. When this happens, the esophagus becomes sore and inflamed. Over time, GERD can create small holes (ulcers) in the lining of the esophagus.  CAUSES This  condition is caused by a problem with the muscle between the esophagus and the stomach (lower esophageal sphincter, or LES). Normally, the LES muscle closes after food passes through the esophagus to the stomach. When the LES is weakened or abnormal, it does not close properly, and that allows food and stomach acid to go back up into the esophagus. The LES can be weakened by certain dietary substances, medicines, and medical conditions, including:  Tobacco use.  Pregnancy.  Having a hiatal hernia.  Heavy alcohol use.  Certain foods and beverages, such as coffee, chocolate, onions, and peppermint. RISK FACTORS This condition is more likely to develop in:  People who have an increased body weight.  People who have connective tissue disorders.  People who use NSAID medicines. SYMPTOMS Symptoms of this condition include:  Heartburn.  Difficult or painful swallowing.  The feeling of having a lump in the throat.  Abitter taste in the mouth.  Bad breath.  Having a large amount of saliva.  Having an upset or bloated stomach.  Belching.  Chest pain.  Shortness of breath or wheezing.  Ongoing (chronic) cough or a night-time cough.  Wearing away of tooth  enamel.  Weight loss. Different conditions can cause chest pain. Make sure to see your health care provider if you experience chest pain. DIAGNOSIS Your health care provider will take a medical history and perform a physical exam. To determine if you have mild or severe GERD, your health care provider may also monitor how you respond to treatment. You may also have other tests, including:  An endoscopy toexamine your stomach and esophagus with a small camera.  A test thatmeasures the acidity level in your esophagus.  A test thatmeasures how much pressure is on your esophagus.  A barium swallow or modified barium swallow to show the shape, size, and functioning of your esophagus. TREATMENT The goal of treatment is to help relieve your symptoms and to prevent complications. Treatment for this condition may vary depending on how severe your symptoms are. Your health care provider may recommend:  Changes to your diet.  Medicine.  Surgery. HOME CARE INSTRUCTIONS Diet  Follow a diet as recommended by your health care provider. This may involve avoiding foods and drinks such as:  Coffee and tea (with or without caffeine).  Drinks that containalcohol.  Energy drinks and sports drinks.  Carbonated drinks or sodas.  Chocolate and cocoa.  Peppermint and mint flavorings.  Garlic and onions.  Horseradish.  Spicy and acidic foods, including peppers, chili powder, curry powder, vinegar, hot sauces, and barbecue sauce.  Citrus fruit juices and citrus fruits, such as oranges, lemons, and limes.  Tomato-based foods, such as red sauce, chili, salsa, and pizza with red sauce.  Fried and fatty foods, such as donuts, french fries, potato chips, and high-fat dressings.  High-fat meats, such as hot dogs and fatty cuts of red and white meats, such as rib eye steak, sausage, ham, and bacon.  High-fat dairy items, such as whole milk, butter, and cream cheese.  Eat small, frequent  meals instead of large meals.  Avoid drinking large amounts of liquid with your meals.  Avoid eating meals during the 2-3 hours before bedtime.  Avoid lying down right after you eat.  Do not exercise right after you eat. General Instructions  Pay attention to any changes in your symptoms.  Take over-the-counter and prescription medicines only as told by your health care provider. Do not take aspirin, ibuprofen, or other NSAIDs unless your  health care provider told you to do so.  Do not use any tobacco products, including cigarettes, chewing tobacco, and e-cigarettes. If you need help quitting, ask your health care provider.  Wear loose-fitting clothing. Do not wear anything tight around your waist that causes pressure on your abdomen.  Raise (elevate) the head of your bed 6 inches (15cm).  Try to reduce your stress, such as with yoga or meditation. If you need help reducing stress, ask your health care provider.  If you are overweight, reduce your weight to an amount that is healthy for you. Ask your health care provider for guidance about a safe weight loss goal.  Keep all follow-up visits as told by your health care provider. This is important. SEEK MEDICAL CARE IF:  You have new symptoms.  You have unexplained weight loss.  You have difficulty swallowing, or it hurts to swallow.  You have wheezing or a persistent cough.  Your symptoms do not improve with treatment.  You have a hoarse voice. SEEK IMMEDIATE MEDICAL CARE IF:  You have pain in your arms, neck, jaw, teeth, or back.  You feel sweaty, dizzy, or light-headed.  You have chest pain or shortness of breath.  You vomit and your vomit looks like blood or coffee grounds.  You faint.  Your stool is bloody or black.  You cannot swallow, drink, or eat.   This information is not intended to replace advice given to you by your health care provider. Make sure you discuss any questions you have with your health  care provider.   Document Released: 07/23/2005 Document Revised: 07/04/2015 Document Reviewed: 02/07/2015 Elsevier Interactive Patient Education [employer].

## 2015-09-26 NOTE — ED Notes (Signed)
Per patient, states shes having pain in the middle of her back and shoulder blades x1 week. Today felt heaviness on chest, felt out of breath. Pt states she takes protonix every day, unsure if its heartburn or not. Pt on BP medication as well, pt taking amoxicillin for wisdom teeth, supposed to have those removed soon. Pt is AAOX4, respirations equal and unlabored, in NAD. 5/10 pain in back, pain in chest 4/10.

## 2016-10-24 ENCOUNTER — Encounter (HOSPITAL_COMMUNITY): Payer: Self-pay

## 2016-10-27 NOTE — L&D Delivery Note (Signed)
Delivery Note At 7:14 PM a viable female was delivered via  (Presentation: OA;  ).  APGAR: 8, 9; weight pending.   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  Cord pH: sent  Anesthesia:  cle Episiotomy: None Lacerations: 2nd degree Suture Repair: 3.0 vicryl Est. Blood Loss (mL): 200  Mom to postpartum.  Baby to Couplet care / Skin to Skin. Baby girl with known pelvic cyst - will be for [place] with peds.  Tyson Dense 05/29/2017, 7:38 PM

## 2016-10-31 LAB — OB RESULTS CONSOLE ANTIBODY SCREEN: ANTIBODY SCREEN: NEGATIVE

## 2016-10-31 LAB — OB RESULTS CONSOLE HEPATITIS B SURFACE ANTIGEN: HEP B S AG: NEGATIVE

## 2016-10-31 LAB — OB RESULTS CONSOLE ABO/RH: RH Type: POSITIVE

## 2016-10-31 LAB — OB RESULTS CONSOLE RUBELLA ANTIBODY, IGM: RUBELLA: IMMUNE

## 2016-10-31 LAB — OB RESULTS CONSOLE HIV ANTIBODY (ROUTINE TESTING): HIV: NONREACTIVE

## 2016-10-31 LAB — OB RESULTS CONSOLE RPR: RPR: NONREACTIVE

## 2016-10-31 LAB — OB RESULTS CONSOLE GC/CHLAMYDIA
Chlamydia: NEGATIVE
GC PROBE AMP, GENITAL: NEGATIVE

## 2016-11-16 ENCOUNTER — Inpatient Hospital Stay (HOSPITAL_COMMUNITY)
Admission: AD | Admit: 2016-11-16 | Discharge: 2016-11-17 | Disposition: A | Discharge status: Home / Self Care | Payer: Self-pay | Source: Ambulatory Visit | Attending: Obstetrics and Gynecology | Admitting: Obstetrics and Gynecology

## 2016-11-16 DIAGNOSIS — O26891 Other specified pregnancy related conditions, first trimester: Secondary | ICD-10-CM | POA: Insufficient documentation

## 2016-11-16 DIAGNOSIS — R1084 Generalized abdominal pain: Secondary | ICD-10-CM

## 2016-11-16 DIAGNOSIS — O26899 Other specified pregnancy related conditions, unspecified trimester: Secondary | ICD-10-CM

## 2016-11-16 DIAGNOSIS — R109 Unspecified abdominal pain: Secondary | ICD-10-CM

## 2016-11-16 DIAGNOSIS — M549 Dorsalgia, unspecified: Secondary | ICD-10-CM

## 2016-11-16 DIAGNOSIS — O9989 Other specified diseases and conditions complicating pregnancy, childbirth and the puerperium: Secondary | ICD-10-CM

## 2016-11-16 DIAGNOSIS — Z3A10 10 weeks gestation of pregnancy: Secondary | ICD-10-CM | POA: Insufficient documentation

## 2016-11-16 DIAGNOSIS — M545 Low back pain: Secondary | ICD-10-CM | POA: Insufficient documentation

## 2016-11-16 DIAGNOSIS — O10011 Pre-existing essential hypertension complicating pregnancy, first trimester: Secondary | ICD-10-CM | POA: Insufficient documentation

## 2016-11-16 HISTORY — DX: Gestational (pregnancy-induced) hypertension without significant proteinuria, unspecified trimester: O13.9

## 2016-11-16 HISTORY — DX: Calculus of kidney: N20.0

## 2016-11-16 HISTORY — DX: Gestational diabetes mellitus in pregnancy, unspecified control: O24.419

## 2016-11-17 ENCOUNTER — Encounter (HOSPITAL_COMMUNITY): Payer: Self-pay

## 2016-11-17 ENCOUNTER — Inpatient Hospital Stay (HOSPITAL_COMMUNITY): Payer: Self-pay

## 2016-11-17 DIAGNOSIS — R1084 Generalized abdominal pain: Secondary | ICD-10-CM

## 2016-11-17 LAB — URINALYSIS, ROUTINE W REFLEX MICROSCOPIC
Bilirubin Urine: NEGATIVE
Glucose, UA: NEGATIVE mg/dL
Ketones, ur: NEGATIVE mg/dL
Leukocytes, UA: NEGATIVE
Nitrite: NEGATIVE
PROTEIN: NEGATIVE mg/dL
SPECIFIC GRAVITY, URINE: 1.009 (ref 1.005–1.030)
pH: 6 (ref 5.0–8.0)

## 2016-11-17 LAB — POCT PREGNANCY, URINE: Preg Test, Ur: POSITIVE — AB

## 2016-11-17 NOTE — MAU Provider Note (Signed)
History     CSN: [id number]  Arrival date and time: 11/16/16 2359   None     Chief Complaint  Patient presents with  . Abdominal Pain  . Back Pain   Courtney [name] is a 41 y/o G6P2 with history of recurrent early miscairages who presents today with severe abdominal and back pain. She reports the pain has been present for about 3 or 4 days and has been worse at night. Tonight is the worst. She reports a 7-8 out of 10. She is on vaginal progesterone because of her early misarranges. She has had 2 ultrasounds in the office that have been normal. She had her nurse appointment and all her pre-natal blood work done about 2 wks ago. She was told she had a UTI and given 5 days of antibiotics which she took. She denies any vaginal bleeding or vaginal discharge at this time.     OB History    Gravida Para Term Preterm AB Living   6 2 2   3 2    SAB TAB Ectopic Multiple Live Births   3       2      Past Medical History:  Diagnosis Date  . Gestational diabetes   . Hypertension   . Kidney stones   . Pregnancy induced hypertension   . Urinary tract infection     Past Surgical History:  Procedure Laterality Date  . CHOLECYSTECTOMY    . KNEE ARTHROSCOPY    . TONSILLECTOMY      History reviewed. No pertinent family history.  Social History  Substance Use Topics  . Smoking status: Never Smoker  . Smokeless tobacco: Never Used  . Alcohol use No    Allergies: No Known Allergies  Prescriptions Prior to Admission  Medication Sig Dispense Refill Last Dose  . Prenatal Vit-Fe Fumarate-FA (PRENATAL MULTIVITAMIN) TABS tablet Take 1 tablet by mouth daily at 12 noon.   Past Week at Unknown time  . progesterone 200 MG SUPP Place 200 mg vaginally at bedtime.   11/16/2016 at Unknown time  . bisacodyl (DULCOLAX) 5 MG EC tablet Take 5 mg by mouth daily as needed for moderate constipation.   06/20/2015 at 1400  . ciprofloxacin (CIPRO) 500 MG tablet Take 1 tablet (500 mg total) by mouth 2 (two) times  daily. 6 tablet 0   . HYDROcodone-acetaminophen (NORCO/VICODIN) 5-325 MG per tablet Take 1-2 tablets by mouth every 6 (six) hours as needed. 6 tablet 0 06/20/2015 at 0800  . ibuprofen (ADVIL,MOTRIN) 600 MG tablet Take 1 tablet (600 mg total) by mouth every 6 (six) hours as needed for moderate pain. (Patient not taking: Reported on 06/20/2015) 30 tablet 0   . lisinopril (PRINIVIL,ZESTRIL) 20 MG tablet Take 20 mg by mouth at bedtime.    06/20/2015 at 2200  . ondansetron (ZOFRAN) 4 MG tablet Take 1 tablet (4 mg total) by mouth every 8 (eight) hours as needed for nausea or vomiting. 12 tablet 0 06/20/2015 at 2200  . oxyCODONE-acetaminophen (PERCOCET) 5-325 MG per tablet Take 1 tablet by mouth every 4 (four) hours as needed for severe pain. 15 tablet 0   . pantoprazole (PROTONIX) 40 MG tablet Take 40 mg by mouth daily.   06/20/2015 at 2200  . tamsulosin (FLOMAX) 0.4 MG CAPS capsule Take 1 capsule (0.4 mg total) by mouth daily. 10 capsule 0 06/21/2015 at 0800    Review of Systems  Constitutional: Negative for chills and fever.  Respiratory: Negative for cough and shortness of  breath.   Cardiovascular: Negative for palpitations and leg swelling.  Gastrointestinal: Positive for abdominal pain. Negative for abdominal distention, constipation, diarrhea, nausea and vomiting.  Genitourinary: Negative for difficulty urinating, dysuria, flank pain and frequency.  Neurological: Negative for dizziness and weakness.   Physical Exam   Blood pressure 169/89, pulse 82, temperature 98.4 F (36.9 C), temperature source Oral, resp. rate 18.  Physical Exam  Constitutional: She is oriented to person, place, and time. She appears well-developed and well-nourished.  Cardiovascular: Normal rate and intact distal pulses.   Respiratory: Effort normal. No respiratory distress.  GI: Soft. Bowel sounds are normal. She exhibits no distension. There is no tenderness. There is no rebound and no guarding.  Morbidly obeese   Genitourinary:  Genitourinary Comments: Cervix visually and digitally closed. Health pink mucosa. No vaginal discharge.   Musculoskeletal: Normal range of motion. She exhibits no edema.  mild tenderness to palpation in the back/paraspinal areas.  Neurological: She is alert and oriented to person, place, and time. No cranial nerve deficit.  Skin: Skin is warm and dry. No erythema.  Psychiatric: She has a normal mood and affect. Her behavior is normal.    MAU Course  Procedures  MDM In [place] Patient underwent  Ultrasound which revealed viable pregnancy at 10wks 0 days.   Speculum exam revealed closed cervix with no vaginal discharge.   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 Recheck show some improvement. Patient is a chronic hypertensive and followed by OB.  Assessment and Plan  #1: Low back pain. Unclear etiology. MSK verus early viral infection. Advised supportive care/heating pad tylenol.  Courtney [name] 11/17/2016, 12:50 AM

## 2016-11-17 NOTE — MAU Note (Signed)
Pt c/o lower back pain and abdominal cramping x3-4 days. States that she had a UTI several weeks ago, but never had symptoms-completed antibiotics. Denies urinary s/s at this time. Denies vag bleeding or vag discharge. Denies constipation or diarrhea. Has not taken anything for pain-wanted to "toughen it out".

## 2016-11-17 NOTE — MAU Note (Signed)
Pt presents complaining of back pain and generalized abdominal pain for 3 days. Denies bleeding or leaking. Has not tried pain medication.

## 2016-11-17 NOTE — Discharge Instructions (Signed)
Back Pain in Pregnancy Introduction Back pain during pregnancy is common. Back pain may be caused by several factors that are related to changes during your pregnancy. Follow these instructions at home: Managing pain, stiffness, and swelling  If directed, apply ice for sudden (acute) back pain.  Put ice in a plastic bag.  Place a towel between your skin and the bag.  Leave the ice on for 20 minutes, 2-3 times per day.  If directed, apply heat to the affected area before you exercise:  Place a towel between your skin and the heat pack or heating pad.  Leave the heat on for 20-30 minutes.  Remove the heat if your skin turns bright red. This is especially important if you are unable to feel pain, heat, or cold. You may have a greater risk of getting burned. Activity  Exercise as told by your health care provider. Exercising is the best way to prevent or manage back pain.  Listen to your body when lifting. If lifting hurts, ask for help or bend your knees. This uses your leg muscles instead of your back muscles.  Squat down when picking up something from the floor. Do not bend over.  Only use bed rest as told by your health care provider. Bed rest should only be used for the most severe episodes of back pain. Standing, Sitting, and Lying Down  Do not stand in one place for long periods of time.  Use good posture when sitting. Make sure your head rests over your shoulders and is not hanging forward. Use a pillow on your lower back if necessary.  Try sleeping on your side, preferably the left side, with a pillow or two between your legs. If you are sore after a night's rest, your bed may be too soft. A firm mattress may provide more support for your back during pregnancy. General instructions  Do not wear high heels.  Eat a healthy diet. Try to gain weight within your health care provider's recommendations.  Use a maternity girdle, elastic sling, or back brace as told by your  health care provider.  Take over-the-counter and prescription medicines only as told by your health care provider.  Keep all follow-up visits as told by your health care provider. This is important. This includes any visits with any specialists, such as a physical therapist. Contact a health care provider if:  Your back pain interferes with your daily activities.  You have increasing pain in other parts of your body. Get help right away if:  You develop numbness, tingling, weakness, or problems with the use of your arms or legs.  You develop severe back pain that is not controlled with medicine.  You have a sudden change in bowel or bladder control.  You develop shortness of breath, dizziness, or you faint.  You develop nausea, vomiting, or sweating.  You have back pain that is a rhythmic, cramping pain similar to labor pains. Labor pain is usually 1-2 minutes apart, lasts for about 1 minute, and involves a bearing down feeling or pressure in your pelvis.  You have back pain and your water breaks or you have vaginal bleeding.  You have back pain or numbness that travels down your leg.  Your back pain developed after you fell.  You develop pain on one side of your back.  You see blood in your urine.  You develop skin blisters in the area of your back pain. This information is not intended to replace advice given to   you by your health care provider. Make sure you discuss any questions you have with your health care provider. Document Released: 01/21/2006 Document Revised: 03/20/2016 Document Reviewed: 06/27/2015  2017 Elsevier  

## 2016-11-20 ENCOUNTER — Other Ambulatory Visit: Payer: Self-pay

## 2016-11-21 ENCOUNTER — Other Ambulatory Visit (HOSPITAL_COMMUNITY): Payer: Self-pay | Admitting: Obstetrics and Gynecology

## 2016-11-21 DIAGNOSIS — Z3A18 18 weeks gestation of pregnancy: Secondary | ICD-10-CM

## 2016-11-21 DIAGNOSIS — O09522 Supervision of elderly multigravida, second trimester: Secondary | ICD-10-CM

## 2016-11-21 DIAGNOSIS — Z3689 Encounter for other specified antenatal screening: Secondary | ICD-10-CM

## 2016-11-21 DIAGNOSIS — O09891 Supervision of other high risk pregnancies, first trimester: Secondary | ICD-10-CM

## 2016-11-21 DIAGNOSIS — O10919 Unspecified pre-existing hypertension complicating pregnancy, unspecified trimester: Secondary | ICD-10-CM

## 2017-01-13 ENCOUNTER — Encounter (HOSPITAL_COMMUNITY): Payer: Self-pay | Admitting: Obstetrics and Gynecology

## 2017-01-16 ENCOUNTER — Ambulatory Visit (HOSPITAL_COMMUNITY): Payer: Self-pay

## 2017-01-20 ENCOUNTER — Ambulatory Visit (HOSPITAL_COMMUNITY)
Admission: RE | Admit: 2017-01-20 | Discharge: 2017-01-20 | Disposition: A | Discharge status: Home / Self Care | Payer: 59 | Source: Ambulatory Visit | Attending: Obstetrics and Gynecology | Admitting: Obstetrics and Gynecology

## 2017-01-20 ENCOUNTER — Encounter (HOSPITAL_COMMUNITY): Payer: Self-pay

## 2017-01-20 ENCOUNTER — Other Ambulatory Visit (HOSPITAL_COMMUNITY): Payer: Self-pay | Admitting: *Deleted

## 2017-01-20 DIAGNOSIS — O10919 Unspecified pre-existing hypertension complicating pregnancy, unspecified trimester: Secondary | ICD-10-CM

## 2017-01-20 DIAGNOSIS — O09522 Supervision of elderly multigravida, second trimester: Secondary | ICD-10-CM | POA: Diagnosis not present

## 2017-01-20 DIAGNOSIS — Z3689 Encounter for other specified antenatal screening: Secondary | ICD-10-CM

## 2017-01-20 DIAGNOSIS — Z363 Encounter for antenatal screening for malformations: Secondary | ICD-10-CM | POA: Insufficient documentation

## 2017-01-20 DIAGNOSIS — Z3A19 19 weeks gestation of pregnancy: Secondary | ICD-10-CM | POA: Insufficient documentation

## 2017-01-20 DIAGNOSIS — O99212 Obesity complicating pregnancy, second trimester: Secondary | ICD-10-CM | POA: Diagnosis not present

## 2017-01-20 DIAGNOSIS — O09292 Supervision of pregnancy with other poor reproductive or obstetric history, second trimester: Secondary | ICD-10-CM | POA: Diagnosis not present

## 2017-01-20 DIAGNOSIS — O09891 Supervision of other high risk pregnancies, first trimester: Secondary | ICD-10-CM

## 2017-01-20 DIAGNOSIS — O352XX1 Maternal care for (suspected) hereditary disease in fetus, fetus 1: Secondary | ICD-10-CM

## 2017-01-20 DIAGNOSIS — O10012 Pre-existing essential hypertension complicating pregnancy, second trimester: Secondary | ICD-10-CM | POA: Diagnosis not present

## 2017-01-20 DIAGNOSIS — Q270 Congenital absence and hypoplasia of umbilical artery: Secondary | ICD-10-CM

## 2017-01-20 DIAGNOSIS — Z3A18 18 weeks gestation of pregnancy: Secondary | ICD-10-CM

## 2017-01-20 NOTE — Progress Notes (Signed)
Genetic Counseling  High-Risk Gestation Note  Appointment Date:  01/20/2017 Referred By: Courtney Farrier, MD Date of Birth:  08-Mar-1976 Partner:  Courtney [name]   Pregnancy History: [id number] Estimated Date of Delivery: 06/14/17 Estimated Gestational Age: [redacted]w[redacted]d Attending: Benjaman Lobe, MD   Mrs. Courtney [name] and her husband, Mr. Courtney [name], were seen for genetic counseling because of a maternal age of 25 and family history concerns.  This couple's 40 year old son also attended the genetic counseling visit today.  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   We reviewed chromosomes, nondisjunction, and the associated 1 in 1 risk for fetal aneuploidy related to a maternal age of 4 at [redacted]w[redacted]d gestation. They were counseled that the risk for aneuploidy decreases as gestational age increases, accounting for those pregnancies which spontaneously abort.  We specifically discussed Down syndrome (trisomy 28), trisomies 33 and 96, and sex chromosome aneuploidies (47,XXX and 47,XXY) including the common features and prognoses of each.   We also reviewed Courtney [name]'s noninvasive prenatal screening (NIPS) result. Specifically,  Courtney [name] had Panorama testing performed at her obstetrician's office. We reviewed that the results from this testing were low risk, indicating that the chance for specific fetal aneuploidy was reduced to less than 1 in 10,000. We reviewed that NIPS analyzes specific placental DNA in maternal circulation. NIPS is considered to be highly specific and sensitive, but is not considered to be a diagnostic test. We reviewed that this testing identifies the majority of pregnancies with trisomies 21, 13, and 18, as well as specific sex chromosome conditions including: 47,XXX, 47,XXY, and 47,XYY.     We reviewed other available options including detailed ultrasound and amniocentesis. We reviewed the approximate 1 in 193 risk for complications for amniocentesis, including spontaneous pregnancy loss. This couple understands that given the normal NIPS result, the risk of fetal aneuploidy is much lower than the risk of a complication related to diagnostic invasive testing. After consideration of these options, they elected to have a detailed ultrasound, but declined amniocentesis. A complete ultrasound was performed today. The ultrasound report will be documented separately. 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 They were counseled that SUA occurs in approximately 1 in every 200 pregnancies and is defined as the presence of one umbilical artery and one umbilical vein, instead of the typical three vessel cord containing two arteries and one vein.  We discussed that the finding of an isolated SUA (no other markers or anomalies visualized) is not thought to increase the risk for fetal aneuploidy. However, the literature is conflicting regarding an association between this finding and congenital heart defects.  In addition, we discussed that SUA is known to be associated with an increased risk for fetal growth restriction.  We reviewed the option of serial ultrasound to monitor fetal growth.  Both family histories were  reviewed and found to be contributory for this couple having a son who has 47,XYY. We reviewed that this condition is caused by paternal nondisjunction during meiosis II, resulting in a female having an extra Y chromosome. We reviewed the common features of this condition and available screening and diagnostic testing options. Specifically, we discussed that NIPS (Panorama) screens for the presence of extra and missing sex chromosomes with high accuracy. They understand that fetuses with 47,XYY typically do not have major physical differences, and as such, a normal ultrasound does not adjust a couple's a priori risk of this condition. Given that 47,XYY occurs due to paternal nondisjunction, the risk of recurrence is not expected to be increased for this or future pregnancies. The overall risk for fetal aneuploidy would be directly related to the maternal age.  In addition, this couple's son with 48,XYY also has a rare form of epilepsy called Lennox-Gastaut syndrome (LGS). We reviewed that this is a rare type of epilepsy characterized by multiple different types of seizures and intellectual disability. The condition is genetically heterogeneous and only 1 in 4 individuals with LGS have a detectable cause. We discussed that some genetic studies have found specific alterations within specific genes, which may contribute to the development of LGS in some individuals. There are also others causes such as brain injuries (infections, birth trauma, frontal [name] damage). Courtney [name] reported that he is a carrier of a specific gene change, which might increase the likelihood of LGS in his children; however, he reported that the geneticist stated that this specific gene alteration is likely not the cause of their son's epilepsy. I will request records to verify the reported history and to learn more about the genetic testing which has been performed for this family.  The remainder of both family histories were noncontributory  for birth defects, intellectual disability, and known genetic conditions. Without further information regarding the provided family history, an accurate genetic risk cannot be calculated. Further genetic counseling is warranted if more information is obtained.  Ms. Dejarnett was provided with written information regarding cystic fibrosis (CF), spinal muscular atrophy (SMA) and hemoglobinopathies including the carrier frequency, availability of carrier screening and prenatal diagnosis if indicated.  In addition, we discussed that CF and hemoglobinopathies are routinely screened for as part of the [place] newborn screening panel.  After further discussion, she declined screening for CF, SMA and hemoglobinopathies.  Ms. Stegall denied exposure to environmental toxins or chemical agents. She denied the use of alcohol, tobacco or street drugs. She denied significant viral illnesses during the course of her pregnancy. 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  We discussed that recurrent pregnancy losses is defined as the occurrence of 2-3+ pregnancy losses.  She was counseled that ~15-20% of all clinically recognized pregnancies result in miscarriage and that 80% occur during the first trimester of pregnancy.  We reviewed the multiple etiologies for recurrent pregnancy losses including: maternal disorders (thyroid disease, diabetes, lupus, etc), anatomical differences (incompetent cervix, abnormal uterine position or shape), environmental causes (nutrition, drugs, alcohol, infections), and genetic causes (chromosome differences, inherited clotting disorders).  Regarding genetic causes, we discussed that once a woman has had 2-3 or more unexplained pregnancy losses, the risk for an inherited chromosome difference in one of the parents (translocation) is ~5-10%.  Peripheral blood chromosome analysis was offered and declined today.  Additionally, we discussed  that there is data that suggests that there is a correlation between thrombophilia and miscarriages.  Ms. Katiya Fike was offered the option of testing for common inherited clotting disorders.  She declined this option today.  She may wish to pursue testing for these conditions in the future, should she wish to become pregnant again.  I counseled this couple regarding the above risks and available options.  The approximate face-to-face time with the genetic counselor was 39 minutes.  Filbert Schilder, MS Certified Genetic Counselor

## 2017-01-21 DIAGNOSIS — O352XX1 Maternal care for (suspected) hereditary disease in fetus, fetus 1: Secondary | ICD-10-CM | POA: Insufficient documentation

## 2017-01-21 DIAGNOSIS — O09522 Supervision of elderly multigravida, second trimester: Secondary | ICD-10-CM | POA: Insufficient documentation

## 2017-01-21 DIAGNOSIS — Z3A19 19 weeks gestation of pregnancy: Secondary | ICD-10-CM | POA: Insufficient documentation

## 2017-02-17 ENCOUNTER — Other Ambulatory Visit (HOSPITAL_COMMUNITY): Payer: Self-pay | Admitting: *Deleted

## 2017-02-17 ENCOUNTER — Encounter (HOSPITAL_COMMUNITY): Payer: Self-pay

## 2017-02-17 ENCOUNTER — Ambulatory Visit (HOSPITAL_COMMUNITY)
Admission: RE | Admit: 2017-02-17 | Discharge: 2017-02-17 | Disposition: A | Discharge status: Home / Self Care | Payer: 59 | Source: Ambulatory Visit | Attending: Obstetrics and Gynecology | Admitting: Obstetrics and Gynecology

## 2017-02-17 DIAGNOSIS — Z362 Encounter for other antenatal screening follow-up: Secondary | ICD-10-CM | POA: Diagnosis present

## 2017-02-17 DIAGNOSIS — O10012 Pre-existing essential hypertension complicating pregnancy, second trimester: Secondary | ICD-10-CM | POA: Insufficient documentation

## 2017-02-17 DIAGNOSIS — O99212 Obesity complicating pregnancy, second trimester: Secondary | ICD-10-CM | POA: Insufficient documentation

## 2017-02-17 DIAGNOSIS — Z3A23 23 weeks gestation of pregnancy: Secondary | ICD-10-CM | POA: Insufficient documentation

## 2017-02-17 DIAGNOSIS — O10919 Unspecified pre-existing hypertension complicating pregnancy, unspecified trimester: Secondary | ICD-10-CM

## 2017-02-17 DIAGNOSIS — O09292 Supervision of pregnancy with other poor reproductive or obstetric history, second trimester: Secondary | ICD-10-CM | POA: Insufficient documentation

## 2017-02-17 DIAGNOSIS — O09522 Supervision of elderly multigravida, second trimester: Secondary | ICD-10-CM | POA: Diagnosis not present

## 2017-02-17 DIAGNOSIS — Q270 Congenital absence and hypoplasia of umbilical artery: Secondary | ICD-10-CM

## 2017-02-27 ENCOUNTER — Other Ambulatory Visit: Payer: Self-pay

## 2017-03-17 ENCOUNTER — Ambulatory Visit (HOSPITAL_COMMUNITY)
Admission: RE | Admit: 2017-03-17 | Discharge: 2017-03-17 | Disposition: A | Discharge status: Home / Self Care | Payer: 59 | Source: Ambulatory Visit | Attending: Obstetrics and Gynecology | Admitting: Obstetrics and Gynecology

## 2017-03-17 ENCOUNTER — Other Ambulatory Visit (HOSPITAL_COMMUNITY): Payer: Self-pay | Admitting: *Deleted

## 2017-03-17 ENCOUNTER — Encounter (HOSPITAL_COMMUNITY): Payer: Self-pay

## 2017-03-17 DIAGNOSIS — O99212 Obesity complicating pregnancy, second trimester: Secondary | ICD-10-CM | POA: Diagnosis not present

## 2017-03-17 DIAGNOSIS — O09292 Supervision of pregnancy with other poor reproductive or obstetric history, second trimester: Secondary | ICD-10-CM | POA: Diagnosis not present

## 2017-03-17 DIAGNOSIS — Z6841 Body Mass Index (BMI) 40.0 and over, adult: Secondary | ICD-10-CM | POA: Insufficient documentation

## 2017-03-17 DIAGNOSIS — O09522 Supervision of elderly multigravida, second trimester: Secondary | ICD-10-CM | POA: Diagnosis not present

## 2017-03-17 DIAGNOSIS — E669 Obesity, unspecified: Secondary | ICD-10-CM | POA: Insufficient documentation

## 2017-03-17 DIAGNOSIS — Z3A27 27 weeks gestation of pregnancy: Secondary | ICD-10-CM | POA: Insufficient documentation

## 2017-03-17 DIAGNOSIS — Z3689 Encounter for other specified antenatal screening: Secondary | ICD-10-CM | POA: Insufficient documentation

## 2017-03-17 DIAGNOSIS — O10912 Unspecified pre-existing hypertension complicating pregnancy, second trimester: Secondary | ICD-10-CM | POA: Diagnosis not present

## 2017-03-17 DIAGNOSIS — Z8632 Personal history of gestational diabetes: Secondary | ICD-10-CM | POA: Diagnosis not present

## 2017-03-17 DIAGNOSIS — O10919 Unspecified pre-existing hypertension complicating pregnancy, unspecified trimester: Secondary | ICD-10-CM

## 2017-04-08 ENCOUNTER — Ambulatory Visit: Payer: 59 | Admitting: Registered"

## 2017-04-14 ENCOUNTER — Other Ambulatory Visit (HOSPITAL_COMMUNITY): Payer: Self-pay | Admitting: *Deleted

## 2017-04-14 ENCOUNTER — Other Ambulatory Visit (HOSPITAL_COMMUNITY): Payer: Self-pay | Admitting: Obstetrics and Gynecology

## 2017-04-14 ENCOUNTER — Encounter (HOSPITAL_COMMUNITY): Payer: Self-pay

## 2017-04-14 ENCOUNTER — Ambulatory Visit (HOSPITAL_COMMUNITY)
Admission: RE | Admit: 2017-04-14 | Discharge: 2017-04-14 | Disposition: A | Discharge status: Home / Self Care | Payer: 59 | Source: Ambulatory Visit | Attending: Obstetrics and Gynecology | Admitting: Obstetrics and Gynecology

## 2017-04-14 DIAGNOSIS — Z3A31 31 weeks gestation of pregnancy: Secondary | ICD-10-CM

## 2017-04-14 DIAGNOSIS — O09522 Supervision of elderly multigravida, second trimester: Secondary | ICD-10-CM

## 2017-04-14 DIAGNOSIS — O09299 Supervision of pregnancy with other poor reproductive or obstetric history, unspecified trimester: Secondary | ICD-10-CM

## 2017-04-14 DIAGNOSIS — Z362 Encounter for other antenatal screening follow-up: Secondary | ICD-10-CM | POA: Diagnosis not present

## 2017-04-14 DIAGNOSIS — O09523 Supervision of elderly multigravida, third trimester: Secondary | ICD-10-CM | POA: Diagnosis present

## 2017-04-14 DIAGNOSIS — O09529 Supervision of elderly multigravida, unspecified trimester: Secondary | ICD-10-CM

## 2017-04-14 NOTE — ED Notes (Signed)
Pt states she has been under a lot of stress in the last week with the passing of her step son.  She states she has been having lots of visitors and long periods of standing.  BP today in left arm,142/67 Rt 141/76.  Pt denies HA, vision changes, or epigastric pain.  Some pedal edema with standing but usually disappears with rest.

## 2017-04-15 ENCOUNTER — Encounter: Payer: 59 | Attending: Obstetrics and Gynecology | Admitting: Registered"

## 2017-04-15 DIAGNOSIS — I10 Essential (primary) hypertension: Secondary | ICD-10-CM | POA: Diagnosis not present

## 2017-04-15 DIAGNOSIS — R7309 Other abnormal glucose: Secondary | ICD-10-CM | POA: Insufficient documentation

## 2017-04-16 ENCOUNTER — Encounter: Payer: Self-pay | Admitting: Registered"

## 2017-04-16 NOTE — Progress Notes (Signed)
Patient was seen on 04/15/2017 for Gestational Diabetes self-management class at the [facility]. The following learning objectives were met by the patient during this course:   States the definition of Gestational Diabetes  States why dietary management is important in controlling blood glucose  Describes the effects each nutrient has on blood glucose levels  Demonstrates ability to create a balanced meal plan  Demonstrates carbohydrate counting   States when to check blood glucose levels  Demonstrates proper blood glucose monitoring techniques  States the effect of stress and exercise on blood glucose levels  States the importance of limiting caffeine and abstaining from alcohol and smoking  Blood glucose monitor given: [employer] Lot # [id number] x Exp: 06/26/17 Blood glucose reading: 97  Patient instructed to monitor glucose levels: FBS: 60 - <90 1 hour: <140 2 hour: <120  Patient received handouts:  Nutrition Diabetes and Pregnancy  Carbohydrate Counting List  Patient will be seen for follow-up as needed.

## 2017-05-12 ENCOUNTER — Ambulatory Visit (HOSPITAL_COMMUNITY)
Admission: RE | Admit: 2017-05-12 | Discharge: 2017-05-12 | Disposition: A | Discharge status: Home / Self Care | Payer: 59 | Source: Ambulatory Visit | Attending: Obstetrics and Gynecology | Admitting: Obstetrics and Gynecology

## 2017-05-12 ENCOUNTER — Other Ambulatory Visit (HOSPITAL_COMMUNITY): Payer: Self-pay | Admitting: *Deleted

## 2017-05-12 ENCOUNTER — Other Ambulatory Visit (HOSPITAL_COMMUNITY): Payer: Self-pay | Admitting: Maternal and Fetal Medicine

## 2017-05-12 ENCOUNTER — Encounter (HOSPITAL_COMMUNITY): Payer: Self-pay

## 2017-05-12 DIAGNOSIS — O09529 Supervision of elderly multigravida, unspecified trimester: Secondary | ICD-10-CM

## 2017-05-12 DIAGNOSIS — Z362 Encounter for other antenatal screening follow-up: Secondary | ICD-10-CM

## 2017-05-12 DIAGNOSIS — O09523 Supervision of elderly multigravida, third trimester: Secondary | ICD-10-CM | POA: Diagnosis not present

## 2017-05-12 DIAGNOSIS — O10013 Pre-existing essential hypertension complicating pregnancy, third trimester: Secondary | ICD-10-CM | POA: Insufficient documentation

## 2017-05-12 DIAGNOSIS — Z3A35 35 weeks gestation of pregnancy: Secondary | ICD-10-CM

## 2017-05-12 DIAGNOSIS — O09293 Supervision of pregnancy with other poor reproductive or obstetric history, third trimester: Secondary | ICD-10-CM | POA: Insufficient documentation

## 2017-05-12 DIAGNOSIS — O359XX0 Maternal care for (suspected) fetal abnormality and damage, unspecified, not applicable or unspecified: Secondary | ICD-10-CM

## 2017-05-12 LAB — OB RESULTS CONSOLE GBS: GBS: POSITIVE

## 2017-05-19 ENCOUNTER — Inpatient Hospital Stay (HOSPITAL_COMMUNITY)
Admission: AD | Admit: 2017-05-19 | Discharge: 2017-05-19 | Disposition: A | Discharge status: Home / Self Care | Payer: 59 | Source: Ambulatory Visit | Attending: Obstetrics and Gynecology | Admitting: Obstetrics and Gynecology

## 2017-05-19 ENCOUNTER — Encounter (HOSPITAL_COMMUNITY): Payer: Self-pay | Admitting: *Deleted

## 2017-05-19 DIAGNOSIS — O26893 Other specified pregnancy related conditions, third trimester: Secondary | ICD-10-CM | POA: Diagnosis present

## 2017-05-19 DIAGNOSIS — Z3A36 36 weeks gestation of pregnancy: Secondary | ICD-10-CM | POA: Diagnosis not present

## 2017-05-19 DIAGNOSIS — O479 False labor, unspecified: Secondary | ICD-10-CM

## 2017-05-19 LAB — URINALYSIS, ROUTINE W REFLEX MICROSCOPIC
BILIRUBIN URINE: NEGATIVE
Glucose, UA: NEGATIVE mg/dL
Ketones, ur: NEGATIVE mg/dL
LEUKOCYTES UA: NEGATIVE
NITRITE: NEGATIVE
PH: 7 (ref 5.0–8.0)
Protein, ur: NEGATIVE mg/dL
SPECIFIC GRAVITY, URINE: 1.005 (ref 1.005–1.030)

## 2017-05-19 NOTE — MAU Note (Signed)
PT SAYS  AT 645PM - UC  BECAME  STRONG.   VE IN OFFICE -  CLOSED.  DENIES HSV AND  MRSA.  GBS- COLLECTED ON Tuesday

## 2017-05-19 NOTE — MAU Note (Signed)
Pt reports contractions every 2-4 mins-rates 3/10. Has a lot of pelvic pressure. Pt denies LOF or vag bleeding. Reports good fetal movement. Cervix was closed on Thursday.

## 2017-05-19 NOTE — Discharge Instructions (Signed)

## 2017-05-29 ENCOUNTER — Inpatient Hospital Stay (HOSPITAL_COMMUNITY)
Admission: AD | Admit: 2017-05-29 | Discharge: 2017-05-30 | DRG: 774 | Disposition: A | Discharge status: Home / Self Care | Payer: 59 | Source: Ambulatory Visit | Attending: Obstetrics and Gynecology | Admitting: Obstetrics and Gynecology

## 2017-05-29 ENCOUNTER — Inpatient Hospital Stay (HOSPITAL_COMMUNITY): Payer: 59 | Admitting: Anesthesiology

## 2017-05-29 ENCOUNTER — Encounter (HOSPITAL_COMMUNITY): Payer: Self-pay

## 2017-05-29 DIAGNOSIS — Z3A37 37 weeks gestation of pregnancy: Secondary | ICD-10-CM | POA: Diagnosis not present

## 2017-05-29 DIAGNOSIS — Z6841 Body Mass Index (BMI) 40.0 and over, adult: Secondary | ICD-10-CM | POA: Diagnosis not present

## 2017-05-29 DIAGNOSIS — O99824 Streptococcus B carrier state complicating childbirth: Secondary | ICD-10-CM | POA: Diagnosis present

## 2017-05-29 DIAGNOSIS — O1002 Pre-existing essential hypertension complicating childbirth: Secondary | ICD-10-CM | POA: Diagnosis present

## 2017-05-29 DIAGNOSIS — O99214 Obesity complicating childbirth: Secondary | ICD-10-CM | POA: Diagnosis present

## 2017-05-29 DIAGNOSIS — O24420 Gestational diabetes mellitus in childbirth, diet controlled: Secondary | ICD-10-CM | POA: Diagnosis present

## 2017-05-29 DIAGNOSIS — O358XX0 Maternal care for other (suspected) fetal abnormality and damage, not applicable or unspecified: Secondary | ICD-10-CM | POA: Diagnosis present

## 2017-05-29 DIAGNOSIS — O4292 Full-term premature rupture of membranes, unspecified as to length of time between rupture and onset of labor: Secondary | ICD-10-CM | POA: Diagnosis present

## 2017-05-29 LAB — CBC
HCT: 32.1 % — ABNORMAL LOW (ref 36.0–46.0)
HEMATOCRIT: 32.1 % — AB (ref 36.0–46.0)
HEMOGLOBIN: 10.7 g/dL — AB (ref 12.0–15.0)
Hemoglobin: 10.8 g/dL — ABNORMAL LOW (ref 12.0–15.0)
MCH: 28.3 pg (ref 26.0–34.0)
MCH: 28.1 pg (ref 26.0–34.0)
MCHC: 33.6 g/dL (ref 30.0–36.0)
MCHC: 33.3 g/dL (ref 30.0–36.0)
MCV: 84 fL (ref 78.0–100.0)
MCV: 84.3 fL (ref 78.0–100.0)
PLATELETS: 287 10*3/uL (ref 150–400)
Platelets: 278 10*3/uL (ref 150–400)
RBC: 3.82 MIL/uL — ABNORMAL LOW (ref 3.87–5.11)
RBC: 3.81 MIL/uL — ABNORMAL LOW (ref 3.87–5.11)
RDW: 14.2 % (ref 11.5–15.5)
RDW: 14.2 % (ref 11.5–15.5)
WBC: 10.3 10*3/uL (ref 4.0–10.5)
WBC: 17.1 10*3/uL — ABNORMAL HIGH (ref 4.0–10.5)

## 2017-05-29 LAB — GLUCOSE, CAPILLARY
GLUCOSE-CAPILLARY: 87 mg/dL (ref 65–99)
Glucose-Capillary: 84 mg/dL (ref 65–99)
Glucose-Capillary: 103 mg/dL — ABNORMAL HIGH (ref 65–99)

## 2017-05-29 LAB — TYPE AND SCREEN
ABO/RH(D): AB POS
Antibody Screen: NEGATIVE

## 2017-05-29 LAB — POCT FERN TEST

## 2017-05-29 MED ORDER — IBUPROFEN 600 MG PO TABS
600.0000 mg | ORAL_TABLET | Freq: Four times a day (QID) | ORAL | Status: DC
  Administered 2017-05-29 – 2017-05-30 (×4): 600 mg via ORAL
  Filled 2017-05-29 (×4): qty 1

## 2017-05-29 MED ORDER — ONDANSETRON HCL 4 MG/2ML IJ SOLN: 4.0000 mg | INTRAMUSCULAR | Status: DC | PRN

## 2017-05-29 MED ORDER — ZOLPIDEM TARTRATE 5 MG PO TABS: 5.0000 mg | ORAL_TABLET | Freq: Every evening | ORAL | Status: DC | PRN

## 2017-05-29 MED ORDER — LIDOCAINE HCL (PF) 1 % IJ SOLN
30.0000 mL | INTRAMUSCULAR | Status: DC | PRN
  Administered 2017-05-29: 30 mL via SUBCUTANEOUS
  Filled 2017-05-29: qty 30

## 2017-05-29 MED ORDER — OXYCODONE-ACETAMINOPHEN 5-325 MG PO TABS: 2.0000 | ORAL_TABLET | ORAL | Status: DC | PRN

## 2017-05-29 MED ORDER — ONDANSETRON HCL 4 MG/2ML IJ SOLN: 4.0000 mg | Freq: Four times a day (QID) | INTRAMUSCULAR | Status: DC | PRN

## 2017-05-29 MED ORDER — LIDOCAINE HCL (PF) 1 % IJ SOLN
INTRAMUSCULAR | Status: DC | PRN
  Administered 2017-05-29: 4 mL
  Administered 2017-05-29: 6 mL via EPIDURAL

## 2017-05-29 MED ORDER — NIFEDIPINE ER OSMOTIC RELEASE 30 MG PO TB24
60.0000 mg | ORAL_TABLET | Freq: Every day | ORAL | Status: DC
  Administered 2017-05-30: 60 mg via ORAL
  Filled 2017-05-29: qty 2

## 2017-05-29 MED ORDER — OXYTOCIN 40 UNITS IN LACTATED RINGERS INFUSION - SIMPLE MED: 2.5000 [IU]/h | INTRAVENOUS | Status: DC

## 2017-05-29 MED ORDER — OXYTOCIN 40 UNITS IN LACTATED RINGERS INFUSION - SIMPLE MED
1.0000 m[IU]/min | INTRAVENOUS | Status: DC
  Administered 2017-05-29: 2 m[IU]/min via INTRAVENOUS
  Filled 2017-05-29: qty 1000

## 2017-05-29 MED ORDER — OXYCODONE-ACETAMINOPHEN 5-325 MG PO TABS: 1.0000 | ORAL_TABLET | ORAL | Status: DC | PRN

## 2017-05-29 MED ORDER — DIPHENHYDRAMINE HCL 50 MG/ML IJ SOLN: 12.5000 mg | INTRAMUSCULAR | Status: DC | PRN

## 2017-05-29 MED ORDER — LACTATED RINGERS IV SOLN: 500.0000 mL | Freq: Once | INTRAVENOUS | Status: DC

## 2017-05-29 MED ORDER — SOD CITRATE-CITRIC ACID 500-334 MG/5ML PO SOLN: 30.0000 mL | ORAL | Status: DC | PRN

## 2017-05-29 MED ORDER — PHENYLEPHRINE 40 MCG/ML (10ML) SYRINGE FOR IV PUSH (FOR BLOOD PRESSURE SUPPORT)
80.0000 ug | PREFILLED_SYRINGE | INTRAVENOUS | Status: DC | PRN
  Filled 2017-05-29: qty 5

## 2017-05-29 MED ORDER — FENTANYL 2.5 MCG/ML BUPIVACAINE 1/10 % EPIDURAL INFUSION (WH - ANES)
14.0000 mL/h | INTRAMUSCULAR | Status: DC | PRN
  Administered 2017-05-29 (×2): 14 mL/h via EPIDURAL
  Filled 2017-05-29: qty 100

## 2017-05-29 MED ORDER — OXYCODONE HCL 5 MG PO TABS: 5.0000 mg | ORAL_TABLET | ORAL | Status: DC | PRN

## 2017-05-29 MED ORDER — EPHEDRINE 5 MG/ML INJ
10.0000 mg | INTRAVENOUS | Status: DC | PRN
  Filled 2017-05-29: qty 2

## 2017-05-29 MED ORDER — BENZOCAINE-MENTHOL 20-0.5 % EX AERO
1.0000 "application " | INHALATION_SPRAY | CUTANEOUS | Status: DC | PRN
  Administered 2017-05-29: 1 via TOPICAL
  Filled 2017-05-29: qty 56

## 2017-05-29 MED ORDER — FLEET ENEMA 7-19 GM/118ML RE ENEM: 1.0000 | ENEMA | RECTAL | Status: DC | PRN

## 2017-05-29 MED ORDER — PHENYLEPHRINE 40 MCG/ML (10ML) SYRINGE FOR IV PUSH (FOR BLOOD PRESSURE SUPPORT)
80.0000 ug | PREFILLED_SYRINGE | INTRAVENOUS | Status: DC | PRN
  Filled 2017-05-29: qty 5
  Filled 2017-05-29: qty 10

## 2017-05-29 MED ORDER — COCONUT OIL OIL: 1.0000 "application " | TOPICAL_OIL | Status: DC | PRN

## 2017-05-29 MED ORDER — PRENATAL MULTIVITAMIN CH
1.0000 | ORAL_TABLET | Freq: Every day | ORAL | Status: DC
  Administered 2017-05-30: 1 via ORAL
  Filled 2017-05-29: qty 1

## 2017-05-29 MED ORDER — TERBUTALINE SULFATE 1 MG/ML IJ SOLN
0.2500 mg | Freq: Once | INTRAMUSCULAR | Status: DC | PRN
  Filled 2017-05-29: qty 1

## 2017-05-29 MED ORDER — ACETAMINOPHEN 325 MG PO TABS: 650.0000 mg | ORAL_TABLET | ORAL | Status: DC | PRN

## 2017-05-29 MED ORDER — LACTATED RINGERS IV SOLN
INTRAVENOUS | Status: DC
  Administered 2017-05-29: 12:00:00 via INTRAVENOUS

## 2017-05-29 MED ORDER — LIDOCAINE-EPINEPHRINE (PF) 2 %-1:200000 IJ SOLN
INTRAMUSCULAR | Status: DC | PRN
  Administered 2017-05-29: 3 mL via EPIDURAL

## 2017-05-29 MED ORDER — DIBUCAINE 1 % RE OINT: 1.0000 "application " | TOPICAL_OINTMENT | RECTAL | Status: DC | PRN

## 2017-05-29 MED ORDER — OXYCODONE HCL 5 MG PO TABS: 10.0000 mg | ORAL_TABLET | ORAL | Status: DC | PRN

## 2017-05-29 MED ORDER — DIPHENHYDRAMINE HCL 25 MG PO CAPS: 25.0000 mg | ORAL_CAPSULE | Freq: Four times a day (QID) | ORAL | Status: DC | PRN

## 2017-05-29 MED ORDER — OXYTOCIN BOLUS FROM INFUSION
500.0000 mL | Freq: Once | INTRAVENOUS | Status: AC
  Administered 2017-05-29: 500 mL via INTRAVENOUS

## 2017-05-29 MED ORDER — PENICILLIN G POT IN DEXTROSE 60000 UNIT/ML IV SOLN
3.0000 10*6.[IU] | INTRAVENOUS | Status: DC
  Administered 2017-05-29: 3 10*6.[IU] via INTRAVENOUS
  Filled 2017-05-29 (×3): qty 50

## 2017-05-29 MED ORDER — ONDANSETRON HCL 4 MG PO TABS: 4.0000 mg | ORAL_TABLET | ORAL | Status: DC | PRN

## 2017-05-29 MED ORDER — SENNOSIDES-DOCUSATE SODIUM 8.6-50 MG PO TABS
2.0000 | ORAL_TABLET | ORAL | Status: DC
  Administered 2017-05-29: 2 via ORAL
  Filled 2017-05-29: qty 2

## 2017-05-29 MED ORDER — PENICILLIN G POTASSIUM 5000000 UNITS IJ SOLR
5.0000 10*6.[IU] | Freq: Once | INTRAVENOUS | Status: AC
  Administered 2017-05-29: 5 10*6.[IU] via INTRAVENOUS
  Filled 2017-05-29: qty 5

## 2017-05-29 MED ORDER — LACTATED RINGERS IV SOLN
500.0000 mL | INTRAVENOUS | Status: DC | PRN
  Administered 2017-05-29: 500 mL via INTRAVENOUS

## 2017-05-29 MED ORDER — SIMETHICONE 80 MG PO CHEW: 80.0000 mg | CHEWABLE_TABLET | ORAL | Status: DC | PRN

## 2017-05-29 MED ORDER — WITCH HAZEL-GLYCERIN EX PADS: 1.0000 "application " | MEDICATED_PAD | CUTANEOUS | Status: DC | PRN

## 2017-05-29 MED ORDER — TETANUS-DIPHTH-ACELL PERTUSSIS 5-2.5-18.5 LF-MCG/0.5 IM SUSP: 0.5000 mL | Freq: Once | INTRAMUSCULAR | Status: DC

## 2017-05-29 NOTE — MAU Note (Signed)
Patient presents with SROM around 8:30 this morning clear fluid, having irregular contractions.

## 2017-05-29 NOTE — Anesthesia Pain Management Evaluation Note (Signed)
  CRNA Pain Management Visit Note  Patient: Courtney [name], 41 y.o., female  "Hello I am a member of the anesthesia team at [facility]. We have an anesthesia team available at all times to provide care throughout the hospital, including epidural management and anesthesia for C-section. I don't know your plan for the delivery whether it a natural birth, water birth, IV sedation, nitrous supplementation, doula or epidural, but we want to meet your pain goals."   1.Was your pain managed to your expectations on prior hospitalizations?   Yes   2.What is your expectation for pain management during this hospitalization?     Epidural  3.How can we help you reach that goal? Epidural in place.  Record the patient's initial score and the patient's pain goal.   Pain: 2  Pain Goal: 9 prior to epidural.  The [facility] wants you to be able to say your pain was always managed very well.  Courtney [name] L 05/29/2017

## 2017-05-29 NOTE — H&P (Signed)
Courtney [name] is a 41 y.o. female presenting for prom at term. OB History    Gravida Para Term Preterm AB Living   6 2 2   3 2    SAB TAB Ectopic Multiple Live Births   3       2     Past Medical History:  Diagnosis Date  . Gestational diabetes   . Hypertension   . Kidney stones   . Pregnancy induced hypertension   . Urinary tract infection    Past Surgical History:  Procedure Laterality Date  . CHOLECYSTECTOMY    . KNEE ARTHROSCOPY    . TONSILLECTOMY     Family History: family history is not on file. Social History:  reports that she has never smoked. She has never used smokeless tobacco. She reports that she does not drink alcohol or use drugs.     Maternal Diabetes: Yes:  Diabetes Type:  Diet controlled Genetic Screening: Normal - Panorama WNL Maternal Ultrasounds/Referrals: Normal Fetal Ultrasounds or other Referrals:  Other:  Fetus with pelvic cyst - peds to be present at delivery Maternal Substance Abuse:  No Significant Maternal Medications:  None Significant Maternal Lab Results:  None Other Comments:  None  ROS History Dilation: 3.5 Exam by:: Dr. Royston Sinner Blood pressure 127/80, pulse (!) 109, temperature 98.3 F (36.8 C), temperature source Oral, resp. rate 18, height 5\' 3"  (1.6 m), weight 115.2 kg (254 lb), last menstrual period 09/03/2016. Exam Physical Exam   NAD, A&O NWOB Abd soft, nondistended, gravid SVE 3-4cm  Prenatal labs: ABO, Rh: --/--/AB POS (08/03 1215) Antibody: NEG (08/03 1215) Rubella: Immune (01/05 0000) RPR: Nonreactive (01/05 0000)  HBsAg: Negative (01/05 0000)  HIV: Non-reactive (01/05 0000)  GBS: Positive (07/17 0000)   Assessment/Plan: 67RF [id number] @ 37.5wga presenting with PROM at early term.   # PROM:  - pitocin  # MWB: - H/o NSVDx2, largest baby 8#6, youngest son w/XYY - A1GDM - BS per protocol - cHTN c/w procardia 60mg , BP in house mildly elevated to normal range - no s/s SIPE   # FWB:  - cat 1 tracing - EFW 8# -  Known pelvic cyst - peds & MFM to be present at delivery  # GBS pos - PCN per protocol  Tyson Dense 05/29/2017, 3:15 PM

## 2017-05-29 NOTE — Anesthesia Preprocedure Evaluation (Signed)
Anesthesia Evaluation  Patient identified by MRN, date of birth, ID band Patient awake    Reviewed: Allergy & Precautions, H&P , Patient's Chart, lab work & pertinent test results  Airway Mallampati: II  TM Distance: >3 FB Neck ROM: full    Dental  (+) Teeth Intact   Pulmonary    breath sounds clear to auscultation       Cardiovascular hypertension,  Rhythm:regular Rate:Normal     Neuro/Psych    GI/Hepatic   Endo/Other  diabetesMorbid obesity  Renal/GU      Musculoskeletal   Abdominal   Peds  Hematology   Anesthesia Other Findings       Reproductive/Obstetrics (+) Pregnancy                             Anesthesia Physical Anesthesia Plan  ASA: III  Anesthesia Plan: Epidural   Post-op Pain Management:    Induction:   PONV Risk Score and Plan:   Airway Management Planned:   Additional Equipment:   Intra-op Plan:   Post-operative Plan:   Informed Consent: I have reviewed the patients History and Physical, chart, labs and discussed the procedure including the risks, benefits and alternatives for the proposed anesthesia with the patient or authorized representative who has indicated his/her understanding and acceptance.   Dental Advisory Given  Plan Discussed with:   Anesthesia Plan Comments: (Labs checked- platelets confirmed with RN in room. Fetal heart tracing, per RN, reported to be stable enough for sitting procedure. Discussed epidural, and patient consents to the procedure:  included risk of possible headache,backache, failed block, allergic reaction, and nerve injury. This patient was asked if she had any questions or concerns before the procedure started.)        Anesthesia Quick Evaluation

## 2017-05-29 NOTE — Anesthesia Procedure Notes (Signed)
Epidural Patient location during procedure: OB  Staffing Anesthesiologist: Lyndle Herrlich  Preanesthetic Checklist Completed: patient identified, site marked, surgical consent, pre-op evaluation, timeout performed, IV checked, risks and benefits discussed and monitors and equipment checked  Epidural Patient position: sitting Prep: site prepped and draped and DuraPrep Patient monitoring: continuous pulse ox and blood pressure Approach: midline Location: L3-L4 Injection technique: LOR air  Needle:  Needle type: Tuohy  Needle gauge: 17 G Needle length: 9 cm and 9 Needle insertion depth: 9 cm Catheter type: closed end flexible Catheter size: 19 Gauge Catheter at skin depth: 18 cm Test dose: negative  Assessment Events: blood not aspirated, injection not painful, no injection resistance, negative IV test and no paresthesia  Additional Notes Dosing of Epidural:  1st dose, through catheter ............................................Marland Kitchen  Xylocaine 40 mg  2nd dose, through catheter, after waiting 3 minutes........Marland KitchenXylocaine 60 mg    As each dose occurred, patient was free of IV sx; and patient exhibited no evidence of SA injection.  Patient is more comfortable after epidural dosed. Please see RN's note for documentation of vital signs,and FHR which are stable.  Patient reminded not to try to ambulate with numb legs, and that an RN must be present when she attempts to get up.

## 2017-05-29 NOTE — Progress Notes (Addendum)
6cm x 3 hours --> IUPC placed --> 8cm, OP, attempt to rotate, placed on peanut ball --> C/c/0, OT, manual rotation attempted, failed. Will start to push to achieve descent - pt uncomf/feeling great deal of rectal pressure.  Pt aware at high risk of CS given EFW 10# on [place]. Pt also aware of risk of shoulder dystocia and the potential outcomes associated with it. Will continue to monitor closely.

## 2017-05-29 NOTE — MAU Note (Signed)
Notified Dr. Royston Sinner patient SROM around 8:30 am clear fluid, irregular contractions, positive fern, cervix .5 cm, received admission orders.

## 2017-05-30 LAB — CBC
HEMATOCRIT: 29.4 % — AB (ref 36.0–46.0)
HEMOGLOBIN: 10 g/dL — AB (ref 12.0–15.0)
MCH: 28.6 pg (ref 26.0–34.0)
MCHC: 34 g/dL (ref 30.0–36.0)
MCV: 84 fL (ref 78.0–100.0)
Platelets: 276 10*3/uL (ref 150–400)
RBC: 3.5 MIL/uL — AB (ref 3.87–5.11)
RDW: 14.2 % (ref 11.5–15.5)
WBC: 16.6 10*3/uL — AB (ref 4.0–10.5)

## 2017-05-30 LAB — RPR: RPR: NONREACTIVE

## 2017-05-30 NOTE — Anesthesia Postprocedure Evaluation (Signed)
Anesthesia Post Note  Patient: Jovanni Eckhart  Procedure(s) Performed: * No procedures listed *     Patient location during evaluation: Mother Baby Anesthesia Type: Epidural Level of consciousness: awake and alert and oriented Pain management: satisfactory to patient Vital Signs Assessment: post-procedure vital signs reviewed and stable Respiratory status: spontaneous breathing and nonlabored ventilation Cardiovascular status: stable Postop Assessment: no headache, no backache, no signs of nausea or vomiting, adequate PO intake and patient able to bend at knees (patient up walking) Anesthetic complications: no    Last Vitals:  Vitals:   05/30/17 0222 05/30/17 0500  BP: (!) 149/76 133/74  Pulse: 84 78  Resp: 14 16  Temp: 36.9 C 36.8 C    Last Pain:  Vitals:   05/30/17 0505  TempSrc:   PainSc: 0-No pain   Pain Goal:                 Shaune Westfall

## 2017-05-30 NOTE — Lactation Note (Signed)
This note was copied from a baby's chart. Lactation Consultation Note Initial visit- 37 5/7 [name] baby. Mom has tried to breast feed 2 previous babies but reports milk supply problems with infant weight loss. Reports this baby is latching well and nursed for 50 min about 1 hour ago. Asleep in dad's arms. Reports no pain with latch. Reports no changes in breast during pregnancy other than nipples were very tender. DEBP set up in room but mom has not used it yet. Reviewed setup, use and cleaning of pump pieces. Encouraged to pump 4-6 times/day and feed all EBM obtained to baby. No questions at present. BF brochure given- reviewed our phone number, OP appointments and BFS[facility] as resources for support after DC. To call for assist prn  Patient Name: Courtney [name] [id number] Date: 05/30/2017 Reason for consult: Initial assessment   Maternal Data Formula Feeding for Exclusion: No Has patient been taught Hand Expression?: Yes Does the patient have breastfeeding experience prior to this delivery?: Yes  Feeding Feeding Type: Breast Fed Length of feed: 40 min  LATCH Score Latch: Repeated attempts needed to sustain latch, nipple held in mouth throughout feeding, stimulation needed to elicit sucking reflex.  Audible Swallowing: Spontaneous and intermittent  Type of Nipple: Everted at rest and after stimulation  Comfort (Breast/Nipple): Soft / non-tender  Hold (Positioning): No assistance needed to correctly position infant at breast.  LATCH Score: 9  Interventions    Lactation Tools Discussed/Used Pump Review: Setup, frequency, and cleaning   Consult Status Consult Status: Follow-up Date: 05/31/17 Follow-up type: In-patient    Truddie Crumble 05/30/2017, 10:47 AM

## 2017-05-30 NOTE — Discharge Summary (Signed)
Obstetric Discharge Summary Reason for Admission: rupture of membranes Prenatal Procedures: none Intrapartum Procedures: spontaneous vaginal delivery Postpartum Procedures: none Complications-Operative and Postpartum: none Hemoglobin  Date Value Ref Range Status  05/30/2017 10.0 (L) 12.0 - 15.0 g/dL Final   HCT  Date Value Ref Range Status  05/30/2017 29.4 (L) 36.0 - 46.0 % Final    Physical Exam:  General: alert, cooperative and appears stated age 41: appropriate Uterine Fundus: firm Incision: healing well, no significant drainage, no dehiscence, no significant erythema DVT Evaluation: No evidence of DVT seen on physical exam. Negative Homan's sign. No cords or calf tenderness. No significant calf/ankle edema.  Discharge Diagnoses: Term Pregnancy-delivered  Discharge Information: Date: 05/30/2017 Activity: pelvic rest Diet: routine Medications: Ibuprofen, Colace and Iron Condition: stable Instructions: refer to practice specific booklet Discharge to: home   Strongly desires discharge today - children at home.  Fu in one week for BP check - pt with h/o cHTN c/w Procardia 60mg .  Pt given strict return precautions.    Newborn Data: Live born female  Birth Weight: 8 lb 14.2 oz (4031 g) APGAR: 8, 9  Home with mother.  Courtney [name] 05/30/2017, 8:54 AM

## 2017-06-02 ENCOUNTER — Ambulatory Visit (HOSPITAL_COMMUNITY)
Admission: RE | Admit: 2017-06-02 | Discharge: 2017-06-02 | Disposition: A | Discharge status: Home / Self Care | Payer: 59 | Source: Ambulatory Visit | Attending: Obstetrics and Gynecology | Admitting: Obstetrics and Gynecology

## 2017-06-02 ENCOUNTER — Encounter (HOSPITAL_COMMUNITY): Payer: Self-pay

## 2017-08-14 ENCOUNTER — Other Ambulatory Visit (HOSPITAL_COMMUNITY): Payer: Self-pay | Admitting: Family Medicine

## 2017-08-14 ENCOUNTER — Ambulatory Visit (HOSPITAL_COMMUNITY)
Admission: RE | Admit: 2017-08-14 | Discharge: 2017-08-14 | Disposition: A | Discharge status: Home / Self Care | Payer: 59 | Source: Ambulatory Visit | Attending: Family Medicine | Admitting: Family Medicine

## 2017-08-14 DIAGNOSIS — M79605 Pain in left leg: Secondary | ICD-10-CM

## 2019-12-23 ENCOUNTER — Emergency Department (HOSPITAL_COMMUNITY)
Admission: EM | Admit: 2019-12-23 | Discharge: 2019-12-23 | Disposition: A | Discharge status: Home / Self Care | Payer: 59 | Attending: Emergency Medicine | Admitting: Emergency Medicine

## 2019-12-23 ENCOUNTER — Emergency Department (HOSPITAL_COMMUNITY): Payer: 59

## 2019-12-23 ENCOUNTER — Encounter (HOSPITAL_COMMUNITY): Payer: Self-pay

## 2019-12-23 ENCOUNTER — Other Ambulatory Visit: Payer: Self-pay

## 2019-12-23 DIAGNOSIS — K219 Gastro-esophageal reflux disease without esophagitis: Secondary | ICD-10-CM | POA: Diagnosis not present

## 2019-12-23 DIAGNOSIS — Z9049 Acquired absence of other specified parts of digestive tract: Secondary | ICD-10-CM | POA: Diagnosis not present

## 2019-12-23 DIAGNOSIS — K2900 Acute gastritis without bleeding: Secondary | ICD-10-CM | POA: Diagnosis not present

## 2019-12-23 DIAGNOSIS — I10 Essential (primary) hypertension: Secondary | ICD-10-CM | POA: Diagnosis not present

## 2019-12-23 DIAGNOSIS — R079 Chest pain, unspecified: Secondary | ICD-10-CM | POA: Diagnosis present

## 2019-12-23 DIAGNOSIS — Z79899 Other long term (current) drug therapy: Secondary | ICD-10-CM | POA: Insufficient documentation

## 2019-12-23 LAB — CBC
HCT: 33.9 % — ABNORMAL LOW (ref 36.0–46.0)
Hemoglobin: 10.8 g/dL — ABNORMAL LOW (ref 12.0–15.0)
MCH: 28.6 pg (ref 26.0–34.0)
MCHC: 31.9 g/dL (ref 30.0–36.0)
MCV: 89.9 fL (ref 80.0–100.0)
Platelets: 372 10*3/uL (ref 150–400)
RBC: 3.77 MIL/uL — ABNORMAL LOW (ref 3.87–5.11)
RDW: 13.2 % (ref 11.5–15.5)
WBC: 8.6 10*3/uL (ref 4.0–10.5)
nRBC: 0 % (ref 0.0–0.2)

## 2019-12-23 LAB — TROPONIN I (HIGH SENSITIVITY)
Troponin I (High Sensitivity): <2 ng/L (ref <18)
Troponin I (High Sensitivity): <2 ng/L (ref <18)

## 2019-12-23 LAB — LIPASE, BLOOD: Lipase: 41 U/L (ref 11–51)

## 2019-12-23 LAB — HEPATIC FUNCTION PANEL
ALT: 18 U/L (ref 0–44)
AST: 18 U/L (ref 15–41)
Albumin: 3.6 g/dL (ref 3.5–5.0)
Alkaline Phosphatase: 51 U/L (ref 38–126)
Bilirubin, Direct: <0.1 mg/dL (ref 0.0–0.2)
Total Bilirubin: 0.3 mg/dL (ref 0.3–1.2)
Total Protein: 7.5 g/dL (ref 6.5–8.1)

## 2019-12-23 LAB — BASIC METABOLIC PANEL
Anion gap: 8 (ref 5–15)
BUN: 14 mg/dL (ref 6–20)
CO2: 27 mmol/L (ref 22–32)
Calcium: 8.5 mg/dL — ABNORMAL LOW (ref 8.9–10.3)
Chloride: 101 mmol/L (ref 98–111)
Creatinine, Ser: 0.47 mg/dL (ref 0.44–1.00)
GFR calc Af Amer: >60 mL/min (ref >60)
GFR calc non Af Amer: >60 mL/min (ref >60)
Glucose, Bld: 129 mg/dL — ABNORMAL HIGH (ref 70–99)
Potassium: 3.5 mmol/L (ref 3.5–5.1)
Sodium: 136 mmol/L (ref 135–145)

## 2019-12-23 MED ORDER — CALCIUM CARBONATE ANTACID 500 MG PO CHEW
1.0000 | CHEWABLE_TABLET | Freq: Once | ORAL | Status: AC
  Administered 2019-12-23: 04:00:00 200 mg via ORAL
  Filled 2019-12-23: qty 1

## 2019-12-23 MED ORDER — FAMOTIDINE 20 MG PO TABS
20.0000 mg | ORAL_TABLET | Freq: Once | ORAL | Status: AC
  Administered 2019-12-23: 20 mg via ORAL
  Filled 2019-12-23: qty 1

## 2019-12-23 MED ORDER — ALUM & MAG HYDROXIDE-SIMETH 200-200-20 MG/5ML PO SUSP
30.0000 mL | Freq: Once | ORAL | Status: AC
  Administered 2019-12-23: 30 mL via ORAL
  Filled 2019-12-23: qty 30

## 2019-12-23 NOTE — ED Triage Notes (Signed)
Pt reports the chest pain that started about 3 days ago, pt says at first she thought it was indigestion or heartburn at first, reports burping and tasting acid. Pt says this has continued and gotten worse, pain is now dull achy and goes through to back.

## 2019-12-23 NOTE — Discharge Instructions (Addendum)
Increase your Protonix to twice a day for the next 2 to 3 weeks.  Look at the diet information and try to follow that.  Take Tums for heartburn in between doses of your Protonix.  Please call the gastroenterologist on-call, Dr. Laural Golden, to have him evaluate you, he may want to do endoscopy to check you for ulcers.  Return to the emergency room if you start vomiting blood, your abdominal pain gets a whole lot worse, or you see blood in your bowel movements.

## 2019-12-23 NOTE — ED Provider Notes (Signed)
[facility] Provider Note   CSN: [id number] Arrival date & time: 12/23/19  0045   Time seen 2:30 AM   History Chief Complaint  Patient presents with  . Chest Pain    Courtney [name] is a 44 y.o. female.  HPI   Patient states she is before she got pregnant with her daughter she had heartburn and had been treated with Protonix.  However she has been off of it many years.  She reports about 2 weeks ago she started having indigestion with epigastric burning, sometimes going up in her chest and with burning acid in her throat.  Her doctor started her on Protonix which she takes at night about a week ago and she got better very rapidly.  However the past 3 days it has come back and is getting worse.  She has elevated the head of her bed.  She also took a Pepcid Complete yesterday which seemed to help however she did not take it today.  She has tried decreasing her caffeine intake.  She has noted no association to the type of food she eats.  She denies any family history of ulcers.  She states she did have a peptic ulcer when she was much younger.  PCP Cory Munch, PA-C   Past Medical History:  Diagnosis Date  . Hypertension   . Kidney stones   . Pregnancy induced hypertension   . Urinary tract infection     Patient Active Problem List   Diagnosis Date Noted  . Normal labor 05/29/2017  . [redacted] weeks gestation of pregnancy   . Advanced maternal age in multigravida, second trimester   . Hereditary disease in family possibly affecting fetus, fetus 1     Past Surgical History:  Procedure Laterality Date  . CHOLECYSTECTOMY    . KNEE ARTHROSCOPY    . TONSILLECTOMY       OB History    Gravida  6   Para  3   Term  3   Preterm      AB  3   Living  3     SAB  3   TAB      Ectopic      Multiple  0   Live Births  3           No family history on file.  Social History   Tobacco Use  . Smoking status: Never Smoker  . Smokeless tobacco:  Never Used  Substance Use Topics  . Alcohol use: No  . Drug use: No    Home Medications Prior to Admission medications   Medication Sig Start Date End Date Taking? Authorizing Provider  docusate sodium (COLACE) 100 MG capsule Take 100 mg by mouth 2 (two) times daily.    [provider]  NIFEdipine (PROCARDIA XL/ADALAT-CC) 60 MG 24 hr tablet Take 60 mg by mouth daily.    [provider]  Prenatal Vit-Fe Fumarate-FA (PRENATAL MULTIVITAMIN) TABS tablet Take 1 tablet by mouth daily at 12 noon.    [provider]    Allergies    Patient has no known allergies.  Review of Systems   Review of Systems  All other systems reviewed and are negative.   Physical Exam Updated Vital Signs BP 126/85   Pulse 85   Resp 15   Ht 5\' 3"  (1.6 m)   Wt 108.9 kg   LMP 12/19/2019 (Exact Date)   SpO2 98%   BMI 42.51 kg/m  Physical Exam Vitals and nursing note reviewed.  Constitutional:      General: She is not in acute distress.    Appearance: Normal appearance. She is well-developed. She is obese. She is not ill-appearing or toxic-appearing.  HENT:     Head: Normocephalic and atraumatic.     Right Ear: External ear normal.     Left Ear: External ear normal.     Nose: Nose normal. No mucosal edema or rhinorrhea.     Mouth/Throat:     Mouth: Mucous membranes are moist.     Dentition: No dental abscesses.     Pharynx: No uvula swelling.  Eyes:     Extraocular Movements: Extraocular movements intact.     Conjunctiva/sclera: Conjunctivae normal.     Pupils: Pupils are equal, round, and reactive to light.  Cardiovascular:     Rate and Rhythm: Normal rate and regular rhythm.     Heart sounds: Normal heart sounds. No murmur. No friction rub. No gallop.   Pulmonary:     Effort: Pulmonary effort is normal. No respiratory distress.     Breath sounds: Normal breath sounds. No wheezing, rhonchi or rales.  Chest:     Chest wall: No tenderness or crepitus.  Abdominal:      General: Bowel sounds are normal. There is no distension.     Palpations: Abdomen is soft.     Tenderness: There is no abdominal tenderness. There is no guarding or rebound.  Musculoskeletal:        General: No tenderness. Normal range of motion.     Cervical back: Full passive range of motion without pain, normal range of motion and neck supple.     Comments: Moves all extremities well.   Skin:    General: Skin is warm and dry.     Coloration: Skin is not pale.     Findings: No erythema or rash.  Neurological:     General: No focal deficit present.     Mental Status: She is alert and oriented to person, place, and time.     Cranial Nerves: No cranial nerve deficit.  Psychiatric:        Mood and Affect: Mood normal. Mood is not anxious.        Speech: Speech normal.        Behavior: Behavior normal.        Thought Content: Thought content normal.     ED Results / Procedures / Treatments   Labs (all labs ordered are listed, but only abnormal results are displayed) Results for orders placed or performed during the hospital encounter of [id number]  Basic metabolic panel  Result Value Ref Range   Sodium 136 135 - 145 mmol/L   Potassium 3.5 3.5 - 5.1 mmol/L   Chloride 101 98 - 111 mmol/L   CO2 27 22 - 32 mmol/L   Glucose, Bld 129 (H) 70 - 99 mg/dL   BUN 14 6 - 20 mg/dL   Creatinine, Ser 0.47 0.44 - 1.00 mg/dL   Calcium 8.5 (L) 8.9 - 10.3 mg/dL   GFR calc non Af Amer >60 >60 mL/min   GFR calc Af Amer >60 >60 mL/min   Anion gap 8 5 - 15  CBC  Result Value Ref Range   WBC 8.6 4.0 - 10.5 K/uL   RBC 3.77 (L) 3.87 - 5.11 MIL/uL   Hemoglobin 10.8 (L) 12.0 - 15.0 g/dL   HCT 33.9 (L) 36.0 - 46.0 %   MCV 89.9 80.0 -  100.0 fL   MCH 28.6 26.0 - 34.0 pg   MCHC 31.9 30.0 - 36.0 g/dL   RDW 13.2 11.5 - 15.5 %   Platelets 372 150 - 400 K/uL   nRBC 0.0 0.0 - 0.2 %  Hepatic function panel  Result Value Ref Range   Total Protein 7.5 6.5 - 8.1 g/dL   Albumin 3.6 3.5 - 5.0 g/dL   AST 18  15 - 41 U/L   ALT 18 0 - 44 U/L   Alkaline Phosphatase 51 38 - 126 U/L   Total Bilirubin 0.3 0.3 - 1.2 mg/dL   Bilirubin, Direct <0.1 0.0 - 0.2 mg/dL   Indirect Bilirubin NOT CALCULATED 0.3 - 0.9 mg/dL  Lipase, blood  Result Value Ref Range   Lipase 41 11 - 51 U/L  Troponin I (High Sensitivity)  Result Value Ref Range   Troponin I (High Sensitivity) <2 <18 ng/L  Troponin I (High Sensitivity)  Result Value Ref Range   Troponin I (High Sensitivity) <2 <18 ng/L    Laboratory interpretation all normal except mild anemia, nonfasting hyperglycemia   EKG EKG Interpretation  Date/Time:  Friday December 23 2019 01:12:23 EST Ventricular Rate:  82 PR Interval:    QRS Duration: 97 QT Interval:  385 QTC Calculation: 450 R Axis:   54 Text Interpretation: Sinus rhythm Baseline wander Otherwise within normal limits No significant change since 26 Sep 2015 Confirmed by Rolland Porter [phone]) on 12/23/2019 1:54:27 AM   Radiology DG Chest 2 View  Result Date: 12/23/2019 CLINICAL DATA:  Chest pain radiating to the back over the last 3 days. EXAM: CHEST - 2 VIEW COMPARISON:  09/26/2015 FINDINGS: Heart size is normal. Mediastinal shadows are normal. The lungs are clear. No bronchial thickening. No infiltrate, mass, effusion or collapse. Pulmonary vascularity is normal. No bony abnormality. IMPRESSION: Normal chest Electronically Signed   By: Nelson Chimes M.D.   On: 12/23/2019 01:50    Procedures Procedures (including critical care time)  Medications Ordered in ED Medications  famotidine (PEPCID) tablet 20 mg (20 mg Oral Given 12/23/19 0259)  alum & mag hydroxide-simeth (MAALOX/MYLANTA) 200-200-20 MG/5ML suspension 30 mL (30 mLs Oral Given 12/23/19 0259)  calcium carbonate (TUMS - dosed in mg elemental calcium) chewable tablet 200 mg of elemental calcium (200 mg of elemental calcium Oral Given 12/23/19 0424)    ED Course  I have reviewed the triage vital signs and the nursing notes.  Pertinent  labs & imaging results that were available during my care of the patient were reviewed by me and considered in my medical decision making (see chart for details).    MDM Rules/Calculators/A&P                      Patient was given Maalox and Pepcid orally while waiting for her delta troponin to be done.  We discussed increasing her tonics to twice a day for couple weeks and following up with gastroenterology to get endoscopy to make sure she does not have ulcers again.  Recheck at 4:08 AM patient states she still having epigastric pain radiating into her back.  When I review her labs she did not have LFTs or lipase done, this was added.  Her delta troponin is pending.  She was given Tums.  Recheck at 5:10 AM patient states her symptoms are mildly better, we discussed her test results.  Concern is that she could be developing an ulcer again.  She was given gastroenterology referral.  She is to increase her Protonix to twice a day and she was given dietary and instructions for gastritis and esophageal reflux disease.  She was given precautions to return to the ER such as vomiting blood, seeing blood in her bowel moods, or severe abdominal pain.  Final Clinical Impression(s) / ED Diagnoses Final diagnoses:  Gastroesophageal reflux disease without esophagitis  Acute gastritis without hemorrhage, unspecified gastritis type    Rx / DC Orders ED Discharge Orders    None      Plan discharge  Rolland Porter, MD, Barbette Or, MD 12/23/19 [phone]

## 2019-12-28 ENCOUNTER — Telehealth: Payer: Self-pay | Admitting: *Deleted

## 2019-12-28 NOTE — Telephone Encounter (Signed)
Updating the clinic she is signed up for will be giving Maderna, not Rote.

## 2019-12-29 ENCOUNTER — Ambulatory Visit: Payer: 59 | Attending: Internal Medicine

## 2019-12-29 DIAGNOSIS — Z23 Encounter for immunization: Secondary | ICD-10-CM | POA: Insufficient documentation

## 2019-12-29 NOTE — Progress Notes (Signed)
   Covid-19 Vaccination Clinic  Name:  Shelby Nila    MRN: [id number] DOB: [birth year]  12/29/2019  Ms. Donatelli was observed post Covid-19 immunization for 15 minutes without incident. She was provided with Vaccine Information Sheet and instruction to access the V-Safe system.   Ms. Lucia was instructed to call 911 with any severe reactions post vaccine: Marland Kitchen Difficulty breathing  . Swelling of face and throat  . A fast heartbeat  . A bad rash all over body  . Dizziness and weakness   Immunizations Administered    Name Date Dose VIS Date Route   Moderna COVID-19 Vaccine 12/29/2019 10:42 AM 0.5 mL 09/27/2019 Intramuscular   Manufacturer: Moderna   Lot: [id number]   [id number]

## 2020-02-01 ENCOUNTER — Ambulatory Visit: Payer: 59

## 2020-02-08 ENCOUNTER — Ambulatory Visit: Payer: 59 | Attending: Internal Medicine

## 2020-02-08 DIAGNOSIS — Z23 Encounter for immunization: Secondary | ICD-10-CM

## 2020-02-08 NOTE — Progress Notes (Signed)
   Covid-19 Vaccination Clinic  Name:  Courtney [name]    MRN: [id number] DOB: 09-May-1976  02/08/2020  Ms. [name] was observed post Covid-19 immunization for 15 minutes without incident. She was provided with Vaccine Information Sheet and instruction to access the V-Safe system.   Ms. [name] was instructed to call 911 with any severe reactions post vaccine: Marland Kitchen Difficulty breathing  . Swelling of face and throat  . A fast heartbeat  . A bad rash all over body  . Dizziness and weakness   Immunizations Administered    Name Date Dose VIS Date Route   Moderna COVID-19 Vaccine 02/08/2020 12:23 PM 0.5 mL 09/27/2019 Intramuscular   Manufacturer: Moderna   Lot: [id number]   [id number]

## 2020-11-15 ENCOUNTER — Ambulatory Visit (HOSPITAL_COMMUNITY)
Admission: RE | Admit: 2020-11-15 | Discharge: 2020-11-15 | Disposition: A | Discharge status: Home / Self Care | Payer: 59 | Source: Ambulatory Visit | Attending: Internal Medicine | Admitting: Internal Medicine

## 2020-11-15 ENCOUNTER — Other Ambulatory Visit: Payer: Self-pay

## 2020-11-15 ENCOUNTER — Other Ambulatory Visit (HOSPITAL_COMMUNITY): Payer: Self-pay | Admitting: Internal Medicine

## 2020-11-15 DIAGNOSIS — R0600 Dyspnea, unspecified: Secondary | ICD-10-CM | POA: Insufficient documentation

## 2020-11-16 ENCOUNTER — Other Ambulatory Visit (HOSPITAL_COMMUNITY): Payer: Self-pay | Admitting: Internal Medicine

## 2020-11-16 ENCOUNTER — Other Ambulatory Visit: Payer: Self-pay | Admitting: Internal Medicine

## 2020-11-16 DIAGNOSIS — E041 Nontoxic single thyroid nodule: Secondary | ICD-10-CM

## 2020-11-23 ENCOUNTER — Ambulatory Visit (HOSPITAL_COMMUNITY)
Admission: RE | Admit: 2020-11-23 | Discharge: 2020-11-23 | Disposition: A | Discharge status: Home / Self Care | Payer: 59 | Source: Ambulatory Visit | Attending: Internal Medicine | Admitting: Internal Medicine

## 2020-11-23 ENCOUNTER — Other Ambulatory Visit: Payer: Self-pay

## 2020-11-23 ENCOUNTER — Other Ambulatory Visit (HOSPITAL_COMMUNITY): Payer: Self-pay | Admitting: Internal Medicine

## 2020-11-23 DIAGNOSIS — R109 Unspecified abdominal pain: Secondary | ICD-10-CM | POA: Insufficient documentation

## 2020-11-26 ENCOUNTER — Other Ambulatory Visit: Payer: Self-pay | Admitting: Internal Medicine

## 2020-11-26 ENCOUNTER — Other Ambulatory Visit (HOSPITAL_COMMUNITY): Payer: Self-pay | Admitting: Internal Medicine

## 2020-11-26 ENCOUNTER — Ambulatory Visit (HOSPITAL_COMMUNITY)
Admission: RE | Admit: 2020-11-26 | Discharge: 2020-11-26 | Disposition: A | Discharge status: Home / Self Care | Payer: 59 | Source: Ambulatory Visit | Attending: Internal Medicine | Admitting: Internal Medicine

## 2020-11-26 ENCOUNTER — Other Ambulatory Visit: Payer: Self-pay

## 2020-11-26 DIAGNOSIS — E041 Nontoxic single thyroid nodule: Secondary | ICD-10-CM | POA: Diagnosis present

## 2020-11-26 DIAGNOSIS — M5414 Radiculopathy, thoracic region: Secondary | ICD-10-CM

## 2020-12-07 ENCOUNTER — Ambulatory Visit (HOSPITAL_COMMUNITY)
Admission: RE | Admit: 2020-12-07 | Discharge: 2020-12-07 | Disposition: A | Discharge status: Home / Self Care | Payer: 59 | Source: Ambulatory Visit | Attending: Internal Medicine | Admitting: Internal Medicine

## 2020-12-07 ENCOUNTER — Other Ambulatory Visit: Payer: Self-pay

## 2020-12-07 DIAGNOSIS — M5414 Radiculopathy, thoracic region: Secondary | ICD-10-CM | POA: Diagnosis not present

## 2021-01-01 ENCOUNTER — Encounter (HOSPITAL_COMMUNITY): Payer: Self-pay

## 2021-01-01 ENCOUNTER — Emergency Department (HOSPITAL_COMMUNITY)
Admission: EM | Admit: 2021-01-01 | Discharge: 2021-01-02 | Disposition: A | Discharge status: Home / Self Care | Payer: 59 | Attending: Emergency Medicine | Admitting: Emergency Medicine

## 2021-01-01 ENCOUNTER — Other Ambulatory Visit: Payer: Self-pay

## 2021-01-01 DIAGNOSIS — N23 Unspecified renal colic: Secondary | ICD-10-CM | POA: Diagnosis not present

## 2021-01-01 DIAGNOSIS — I10 Essential (primary) hypertension: Secondary | ICD-10-CM | POA: Insufficient documentation

## 2021-01-01 DIAGNOSIS — Z79899 Other long term (current) drug therapy: Secondary | ICD-10-CM | POA: Insufficient documentation

## 2021-01-01 DIAGNOSIS — R109 Unspecified abdominal pain: Secondary | ICD-10-CM | POA: Diagnosis present

## 2021-01-01 DIAGNOSIS — N200 Calculus of kidney: Secondary | ICD-10-CM | POA: Insufficient documentation

## 2021-01-01 NOTE — ED Triage Notes (Signed)
Left flank pain started this afternoon. Hx of kidney stones, feels similar.

## 2021-01-02 ENCOUNTER — Emergency Department (HOSPITAL_COMMUNITY): Payer: 59

## 2021-01-02 LAB — CBC WITH DIFFERENTIAL/PLATELET
Abs Immature Granulocytes: 0.05 10*3/uL (ref 0.00–0.07)
Basophils Absolute: 0.1 10*3/uL (ref 0.0–0.1)
Basophils Relative: 1 %
Eosinophils Absolute: 0.2 10*3/uL (ref 0.0–0.5)
Eosinophils Relative: 2 %
HCT: 30.8 % — ABNORMAL LOW (ref 36.0–46.0)
Hemoglobin: 9.5 g/dL — ABNORMAL LOW (ref 12.0–15.0)
Immature Granulocytes: 1 %
Lymphocytes Relative: 23 %
Lymphs Abs: 2.2 10*3/uL (ref 0.7–4.0)
MCH: 26.3 pg (ref 26.0–34.0)
MCHC: 30.8 g/dL (ref 30.0–36.0)
MCV: 85.3 fL (ref 80.0–100.0)
Monocytes Absolute: 0.7 10*3/uL (ref 0.1–1.0)
Monocytes Relative: 7 %
Neutro Abs: 6.6 10*3/uL (ref 1.7–7.7)
Neutrophils Relative %: 66 %
Platelets: 390 10*3/uL (ref 150–400)
RBC: 3.61 MIL/uL — ABNORMAL LOW (ref 3.87–5.11)
RDW: 15.5 % (ref 11.5–15.5)
WBC: 9.7 10*3/uL (ref 4.0–10.5)
nRBC: 0 % (ref 0.0–0.2)

## 2021-01-02 LAB — URINALYSIS, ROUTINE W REFLEX MICROSCOPIC
Bilirubin Urine: NEGATIVE
Glucose, UA: NEGATIVE mg/dL
Ketones, ur: NEGATIVE mg/dL
Nitrite: NEGATIVE
Protein, ur: NEGATIVE mg/dL
Specific Gravity, Urine: 1.009 (ref 1.005–1.030)
pH: 6 (ref 5.0–8.0)

## 2021-01-02 LAB — BASIC METABOLIC PANEL
Anion gap: 8 (ref 5–15)
BUN: 11 mg/dL (ref 6–20)
CO2: 24 mmol/L (ref 22–32)
Calcium: 8.7 mg/dL — ABNORMAL LOW (ref 8.9–10.3)
Chloride: 102 mmol/L (ref 98–111)
Creatinine, Ser: 0.56 mg/dL (ref 0.44–1.00)
GFR, Estimated: >60 mL/min (ref >60)
Glucose, Bld: 109 mg/dL — ABNORMAL HIGH (ref 70–99)
Potassium: 3.9 mmol/L (ref 3.5–5.1)
Sodium: 134 mmol/L — ABNORMAL LOW (ref 135–145)

## 2021-01-02 LAB — POC URINE PREG, ED: Preg Test, Ur: NEGATIVE

## 2021-01-02 MED ORDER — ONDANSETRON 4 MG PO TBDP: ORAL_TABLET | ORAL | 0 refills | Status: DC

## 2021-01-02 MED ORDER — ONDANSETRON HCL 4 MG PO TABS: 4.0000 mg | ORAL_TABLET | Freq: Three times a day (TID) | ORAL | 0 refills | Status: DC | PRN

## 2021-01-02 MED ORDER — KETOROLAC TROMETHAMINE 30 MG/ML IJ SOLN
30.0000 mg | Freq: Once | INTRAMUSCULAR | Status: AC
  Administered 2021-01-02: 30 mg via INTRAMUSCULAR
  Filled 2021-01-02: qty 1

## 2021-01-02 MED ORDER — OXYCODONE-ACETAMINOPHEN 5-325 MG PO TABS: 1.0000 | ORAL_TABLET | ORAL | 0 refills | Status: DC | PRN

## 2021-01-02 MED ORDER — IBUPROFEN 800 MG PO TABS: 800.0000 mg | ORAL_TABLET | Freq: Three times a day (TID) | ORAL | 0 refills | Status: DC

## 2021-01-02 MED ORDER — TAMSULOSIN HCL 0.4 MG PO CAPS: 0.4000 mg | ORAL_CAPSULE | Freq: Every day | ORAL | 0 refills | Status: DC

## 2021-01-02 MED ORDER — CEPHALEXIN 500 MG PO CAPS
1000.0000 mg | ORAL_CAPSULE | Freq: Once | ORAL | Status: AC
  Administered 2021-01-02: 1000 mg via ORAL
  Filled 2021-01-02: qty 2

## 2021-01-02 MED ORDER — OXYCODONE-ACETAMINOPHEN 5-325 MG PO TABS: 2.0000 | ORAL_TABLET | ORAL | 0 refills | Status: DC | PRN

## 2021-01-02 MED ORDER — TAMSULOSIN HCL 0.4 MG PO CAPS
0.4000 mg | ORAL_CAPSULE | Freq: Once | ORAL | Status: AC
  Administered 2021-01-02: 0.4 mg via ORAL
  Filled 2021-01-02: qty 1

## 2021-01-02 MED ORDER — OXYCODONE-ACETAMINOPHEN 5-325 MG PO TABS
2.0000 | ORAL_TABLET | Freq: Once | ORAL | Status: AC
Start: 2021-01-02 — End: 2021-01-02
  Administered 2021-01-02: 2 via ORAL
  Filled 2021-01-02: qty 2

## 2021-01-02 MED ORDER — CEPHALEXIN 500 MG PO CAPS: 500.0000 mg | ORAL_CAPSULE | Freq: Four times a day (QID) | ORAL | 0 refills | Status: DC

## 2021-01-02 NOTE — ED Provider Notes (Signed)
[facility] EMERGENCY DEPARTMENT Provider Note   CSN: [id number] Arrival date & time: 01/01/21  2345     History Chief Complaint  Patient presents with  . Flank Pain    Courtney [name] is a 45 y.o. female.  H/o kidney stones and one lithotripsy for a 11 mm stone.  The history is provided by the patient and medical records.  Flank Pain This is a recurrent problem. The current episode started 3 to 5 hours ago. The problem occurs constantly. The problem has been gradually improving. Pertinent negatives include no chest pain, no abdominal pain, no headaches and no shortness of breath. Nothing aggravates the symptoms. Nothing relieves the symptoms. She has tried nothing for the symptoms. The treatment provided no relief.       Past Medical History:  Diagnosis Date  . Hypertension   . Kidney stones   . Pregnancy induced hypertension   . Urinary tract infection     Patient Active Problem List   Diagnosis Date Noted  . Normal labor 05/29/2017  . [redacted] weeks gestation of pregnancy   . Advanced maternal age in multigravida, second trimester   . Hereditary disease in family possibly affecting fetus, fetus 1     Past Surgical History:  Procedure Laterality Date  . CHOLECYSTECTOMY    . KNEE ARTHROSCOPY    . TONSILLECTOMY       OB History    Gravida  6   Para  3   Term  3   Preterm      AB  3   Living  3     SAB  3   IAB      Ectopic      Multiple  0   Live Births  3           History reviewed. No pertinent family history.  Social History   Tobacco Use  . Smoking status: Never Smoker  . Smokeless tobacco: Never Used  Substance Use Topics  . Alcohol use: No  . Drug use: No    Home Medications Prior to Admission medications   Medication Sig Start Date End Date Taking? Authorizing Provider  cephALEXin (KEFLEX) 500 MG capsule Take 1 capsule (500 mg total) by mouth 4 (four) times daily. 01/02/21  Yes Kielan Dreisbach, Corene Cornea, MD  ibuprofen (ADVIL) 800 MG tablet  Take 1 tablet (800 mg total) by mouth 3 (three) times daily. 01/02/21  Yes Libra Gatz, Corene Cornea, MD  ondansetron (ZOFRAN ODT) 4 MG disintegrating tablet 8mg  ODT q4 hours prn nausea 01/02/21  Yes Eladio Dentremont, Corene Cornea, MD  oxyCODONE-acetaminophen (PERCOCET/ROXICET) 5-325 MG tablet Take 2 tablets by mouth every 4 (four) hours as needed for severe pain. 01/02/21  Yes Jorrell Kuster, Corene Cornea, MD  tamsulosin (FLOMAX) 0.4 MG CAPS capsule Take 1 capsule (0.4 mg total) by mouth daily. 01/02/21  Yes Suresh Audi, Corene Cornea, MD  docusate sodium (COLACE) 100 MG capsule Take 100 mg by mouth 2 (two) times daily.    [provider]  NIFEdipine (PROCARDIA XL/ADALAT-CC) 60 MG 24 hr tablet Take 60 mg by mouth daily.    [provider]  ondansetron (ZOFRAN) 4 MG tablet Take 1 tablet (4 mg total) by mouth every 8 (eight) hours as needed for nausea or vomiting. 01/02/21   Conswella Bruney, Corene Cornea, MD  oxyCODONE-acetaminophen (PERCOCET) 5-325 MG tablet Take 1 tablet by mouth every 4 (four) hours as needed. 01/02/21   Hence Derrick, Corene Cornea, MD  Prenatal Vit-Fe Fumarate-FA (PRENATAL MULTIVITAMIN) TABS tablet Take 1 tablet by mouth daily at 12  noon.    [provider]    Allergies    Patient has no known allergies.  Review of Systems   Review of Systems  Respiratory: Negative for shortness of breath.   Cardiovascular: Negative for chest pain.  Gastrointestinal: Negative for abdominal pain.  Genitourinary: Positive for flank pain.  Neurological: Negative for headaches.  All other systems reviewed and are negative.   Physical Exam Updated Vital Signs BP (!) 107/52 (BP Location: Right Arm)   Pulse 81   Temp 98.2 F (36.8 C) (Oral)   Resp 18   Ht 5\' 3"  (1.6 m)   Wt 108.9 kg   SpO2 97%   BMI 42.53 kg/m   Physical Exam Vitals and nursing note reviewed.  Constitutional:      Appearance: She is well-developed and well-nourished.  HENT:     Head: Normocephalic and atraumatic.     Nose: Nose normal. No congestion or rhinorrhea.      Mouth/Throat:     Mouth: Mucous membranes are moist.     Pharynx: Oropharynx is clear.  Eyes:     Pupils: Pupils are equal, round, and reactive to light.  Cardiovascular:     Rate and Rhythm: Normal rate and regular rhythm.  Pulmonary:     Effort: No respiratory distress.     Breath sounds: No stridor.  Abdominal:     General: Abdomen is flat. There is no distension.  Musculoskeletal:        General: No swelling or tenderness. Normal range of motion.     Cervical back: Normal range of motion.  Skin:    General: Skin is warm and dry.  Neurological:     General: No focal deficit present.     Mental Status: She is alert.     ED Results / Procedures / Treatments   Labs (all labs ordered are listed, but only abnormal results are displayed) Labs Reviewed  URINALYSIS, ROUTINE W REFLEX MICROSCOPIC - Abnormal; Notable for the following components:      Result Value   Color, Urine STRAW (*)    Hgb urine dipstick MODERATE (*)    Leukocytes,Ua LARGE (*)    Bacteria, UA RARE (*)    All other components within normal limits  CBC WITH DIFFERENTIAL/PLATELET - Abnormal; Notable for the following components:   RBC 3.61 (*)    Hemoglobin 9.5 (*)    HCT 30.8 (*)    All other components within normal limits  BASIC METABOLIC PANEL - Abnormal; Notable for the following components:   Sodium 134 (*)    Glucose, Bld 109 (*)    Calcium 8.7 (*)    All other components within normal limits  URINE CULTURE  POC URINE PREG, ED  POC URINE PREG, ED    EKG None  Radiology CT Renal Stone Study  Result Date: 01/02/2021 CLINICAL DATA:  Left-sided flank pain EXAM: CT ABDOMEN AND PELVIS WITHOUT CONTRAST TECHNIQUE: Multidetector CT imaging of the abdomen and pelvis was performed following the standard protocol without IV contrast. COMPARISON:  06/18/2015 FINDINGS: Lower chest: No acute abnormality. Hepatobiliary: No focal liver abnormality is seen. Status post cholecystectomy. No biliary dilatation.  Pancreas: Unremarkable. No pancreatic ductal dilatation or surrounding inflammatory changes. Spleen: Normal in size without focal abnormality. Adrenals/Urinary Tract: Adrenal glands are within normal limits. Kidneys demonstrate bilateral renal calculi. The largest of these lies in the lower pole of the collecting system on the left measuring just over 10 mm. These calculi are new from the  prior exam. 5 mm distal left ureteral stone is noted with mild hydronephrotic changes. Right ureter is within normal limits. Bladder is decompressed. Stomach/Bowel: Appendix is within normal limits. No obstructive or inflammatory changes of the colon are seen. Small bowel is within normal limits. No gastric abnormality is seen. Vascular/Lymphatic: No significant vascular findings are present. No enlarged abdominal or pelvic lymph nodes. Reproductive: Changes of tubal ligation are noted. The uterus and ovaries are otherwise within normal limits. Other: No abdominal wall hernia or abnormality. No abdominopelvic ascites. Musculoskeletal: No acute or significant osseous findings. IMPRESSION: Bilateral renal calculi. The largest of these lies in the left lower pole measuring approximately 10 mm. Distal left ureteral stone measuring 5 mm with mild hydronephrosis. Electronically Signed   By: Inez Catalina M.D.   On: 01/02/2021 00:35    Procedures Procedures   Medications Ordered in ED Medications  ketorolac (TORADOL) 30 MG/ML injection 30 mg (30 mg Intramuscular Given 01/02/21 0054)  oxyCODONE-acetaminophen (PERCOCET/ROXICET) 5-325 MG per tablet 2 tablet (2 tablets Oral Given 01/02/21 0054)  tamsulosin (FLOMAX) capsule 0.4 mg (0.4 mg Oral Given 01/02/21 0054)  cephALEXin (KEFLEX) capsule 1,000 mg (1,000 mg Oral Given 01/02/21 0152)    ED Course  I have reviewed the triage vital signs and the nursing notes.  Pertinent labs & imaging results that were available during my care of the patient were reviewed by me and considered in my  medical decision making (see chart for details).    MDM Rules/Calculators/A&P                          Pain similar to previous and improved with meds here. No e/o obstruction or sepsis requiring hospitalization. D/c with meds and outpatient recommendations.   Final Clinical Impression(s) / ED Diagnoses Final diagnoses:  Renal stone  Renal colic    Rx / DC Orders ED Discharge Orders         Ordered    oxyCODONE-acetaminophen (PERCOCET/ROXICET) 5-325 MG tablet  Every 4 hours PRN        01/02/21 0139    ondansetron (ZOFRAN) 4 MG tablet  Every 8 hours PRN,   Status:  Discontinued        01/02/21 0139    oxyCODONE-acetaminophen (PERCOCET) 5-325 MG tablet  Every 4 hours PRN,   Status:  Discontinued        01/02/21 0143    ondansetron (ZOFRAN ODT) 4 MG disintegrating tablet        01/02/21 0143    tamsulosin (FLOMAX) 0.4 MG CAPS capsule  Daily        01/02/21 0143    ibuprofen (ADVIL) 800 MG tablet  3 times daily        01/02/21 0143    oxyCODONE-acetaminophen (PERCOCET) 5-325 MG tablet  Every 4 hours PRN        01/02/21 0144    ondansetron (ZOFRAN) 4 MG tablet  Every 8 hours PRN        01/02/21 0144    cephALEXin (KEFLEX) 500 MG capsule  4 times daily        01/02/21 0146           Jouri Threat, Corene Cornea, MD 01/02/21 [phone]

## 2021-01-02 NOTE — ED Notes (Signed)
Pt transported to CT ?

## 2021-01-02 NOTE — ED Notes (Signed)
Pt returned from CT °

## 2021-01-02 NOTE — ED Notes (Addendum)
Pt ambulated to bathroom without difficulty. Urine sample obtained.

## 2021-01-03 MED FILL — Oxycodone w/ Acetaminophen Tab 5-325 MG: ORAL | Qty: 6 | Status: AC

## 2021-01-04 ENCOUNTER — Other Ambulatory Visit (HOSPITAL_COMMUNITY): Payer: Self-pay | Admitting: Family Medicine

## 2021-01-04 ENCOUNTER — Other Ambulatory Visit: Payer: Self-pay

## 2021-01-04 ENCOUNTER — Ambulatory Visit (HOSPITAL_COMMUNITY)
Admission: RE | Admit: 2021-01-04 | Discharge: 2021-01-04 | Disposition: A | Discharge status: Home / Self Care | Payer: 59 | Source: Ambulatory Visit | Attending: Family Medicine | Admitting: Family Medicine

## 2021-01-04 ENCOUNTER — Other Ambulatory Visit: Payer: Self-pay | Admitting: Family Medicine

## 2021-01-04 DIAGNOSIS — M79662 Pain in left lower leg: Secondary | ICD-10-CM

## 2021-01-04 LAB — URINE CULTURE: Culture: >=100000 — AB

## 2022-04-08 IMAGING — DX DG CHEST 2V
2 series · 2 of 2 positions shown · non-contrast
Comparison: 12/23/2019

CLINICAL DATA: Dyspnea

EXAM:
CHEST - 2 VIEW

[chest pa]
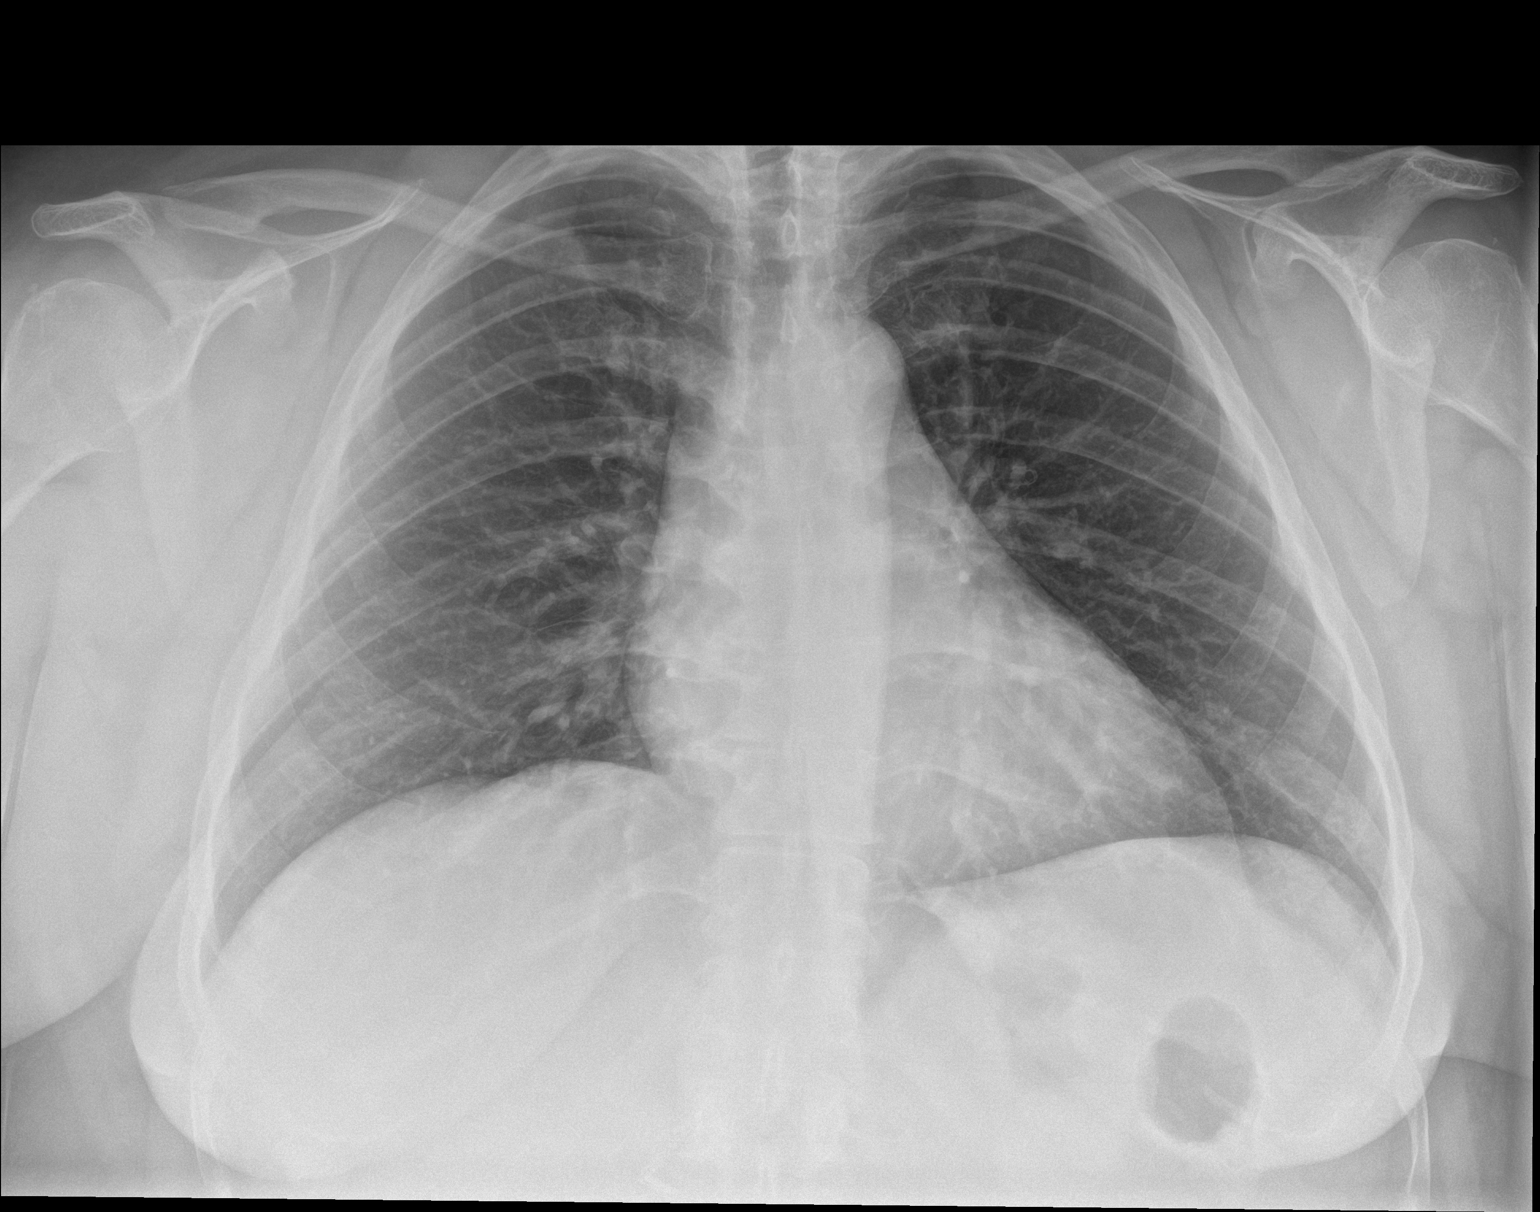

[chest lat]
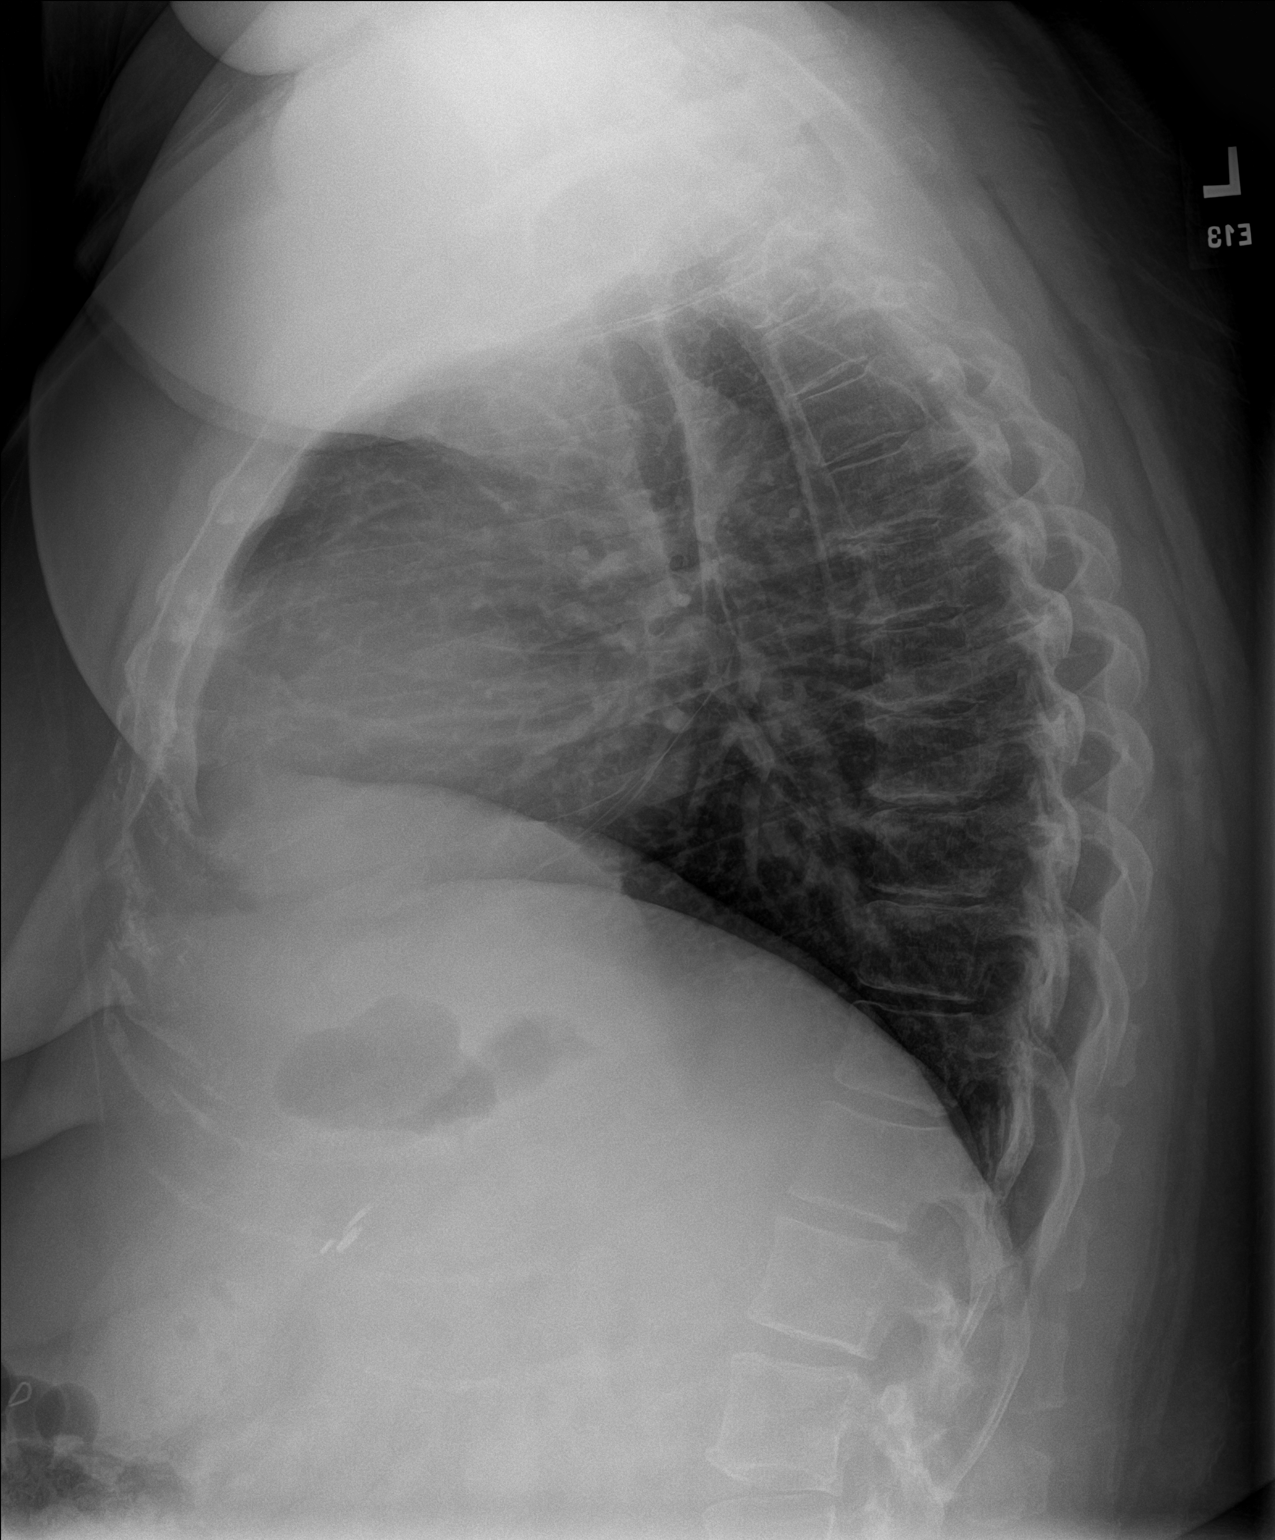

[2 of 2 positions shown; findings below may reference images not displayed]

FINDINGS: The heart size and mediastinal contours are within normal limits.
Both lungs are clear. The visualized skeletal structures are
unremarkable.
IMPRESSION: No active cardiopulmonary disease.

## 2022-04-16 IMAGING — US US ABDOMEN COMPLETE
1 series · 13 of 25 positions shown · non-contrast
Comparison: CT abdomen pelvis June 18, 2015.

CLINICAL DATA: Unspecified abdominal pain

EXAM:
ABDOMEN ULTRASOUND COMPLETE

[Series 1: us abdomen complete · 13 of 99 slices shown]
[im 1/99]
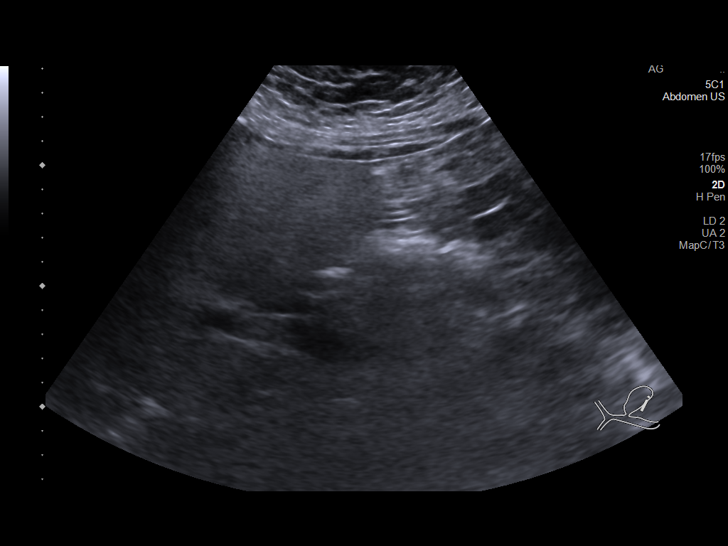
[im 9/99]
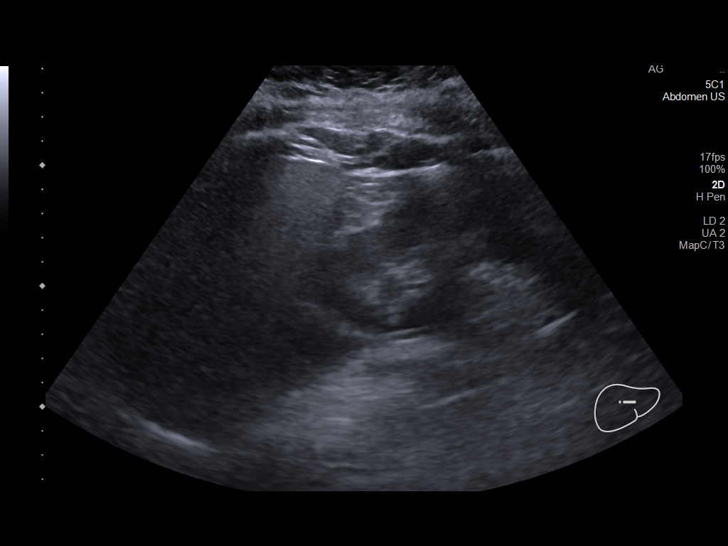
[im 17/99]
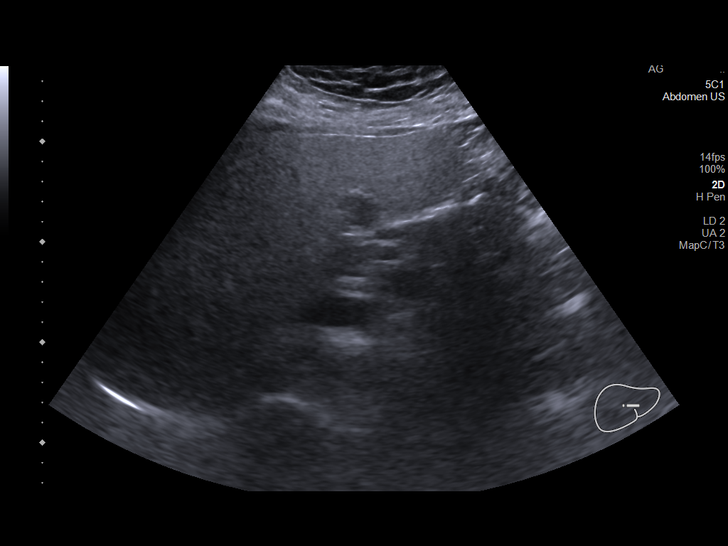
[im 25/99]
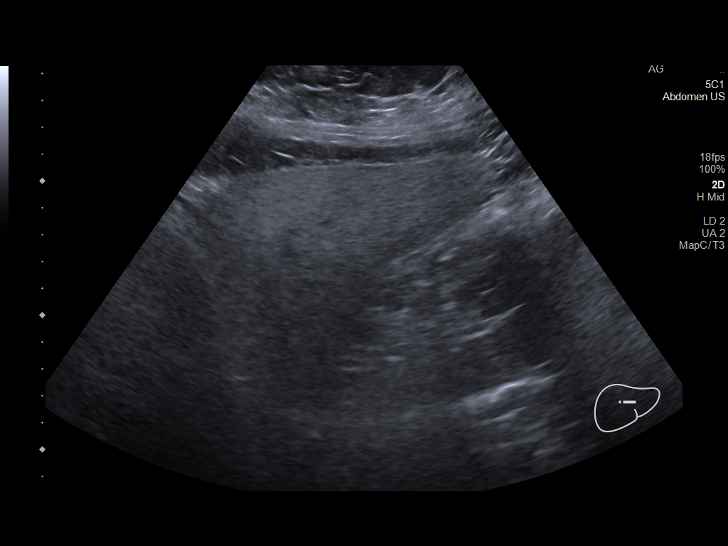
[im 33/99]
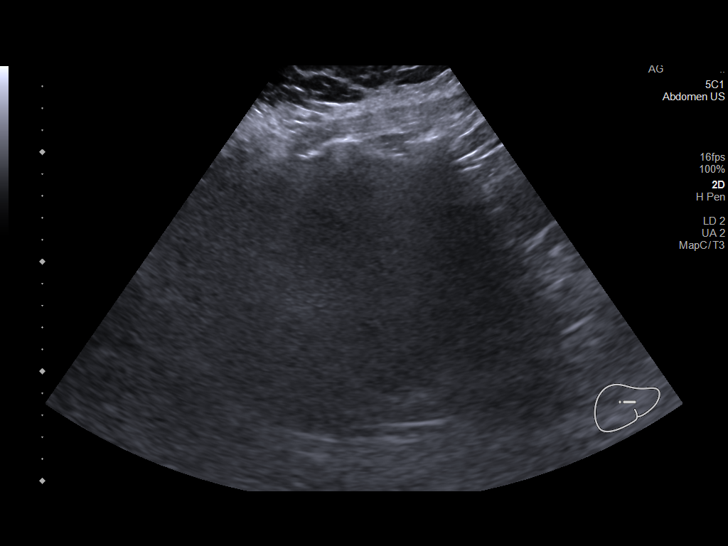
[im 41/99]
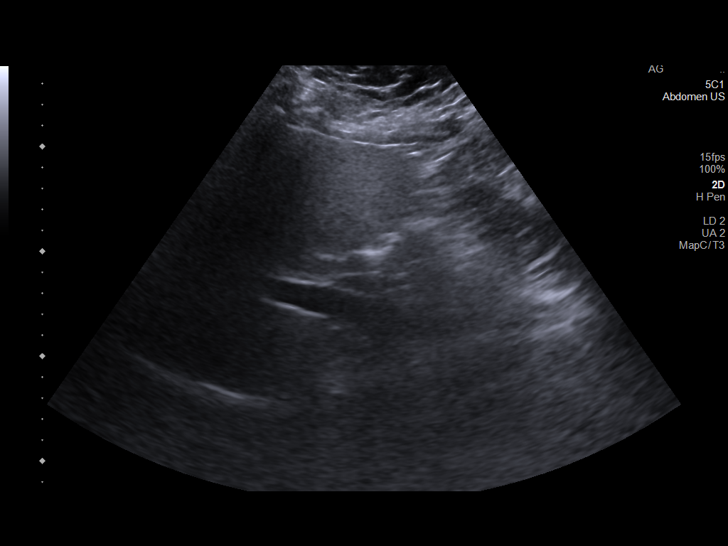
[im 50/99]
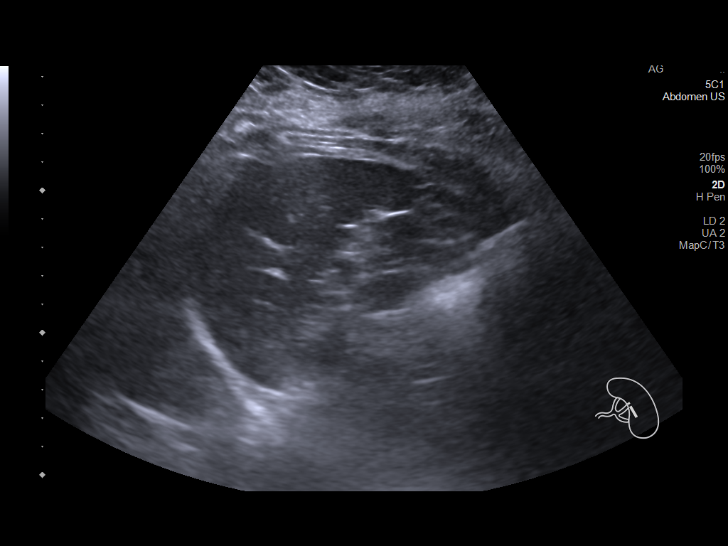
[im 58/99]
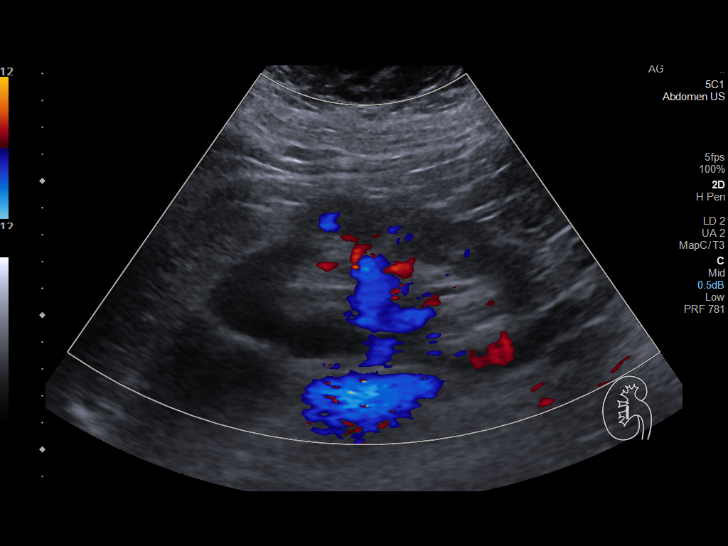
[im 66/99]
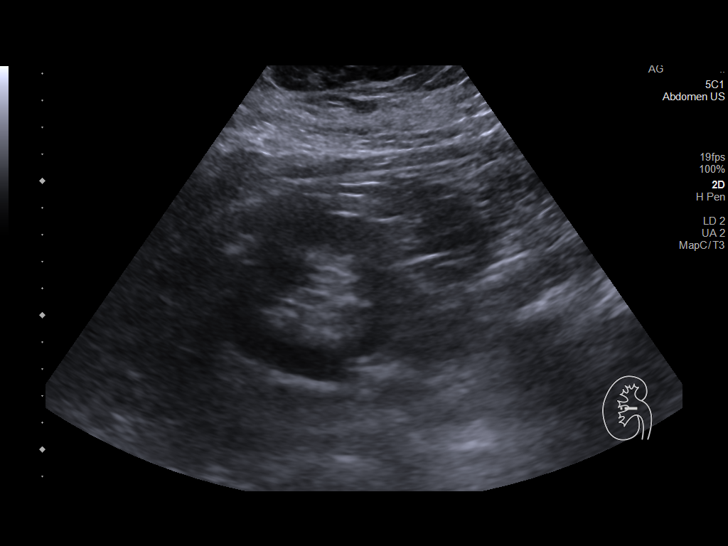
[im 74/99]
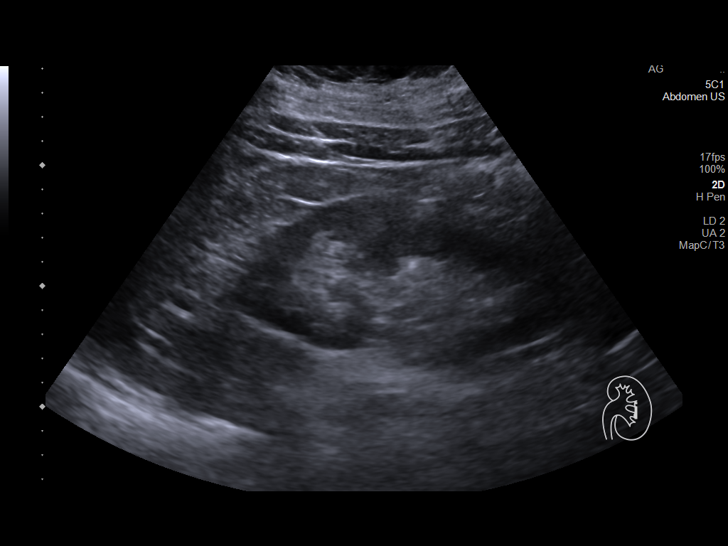
[im 82/99]
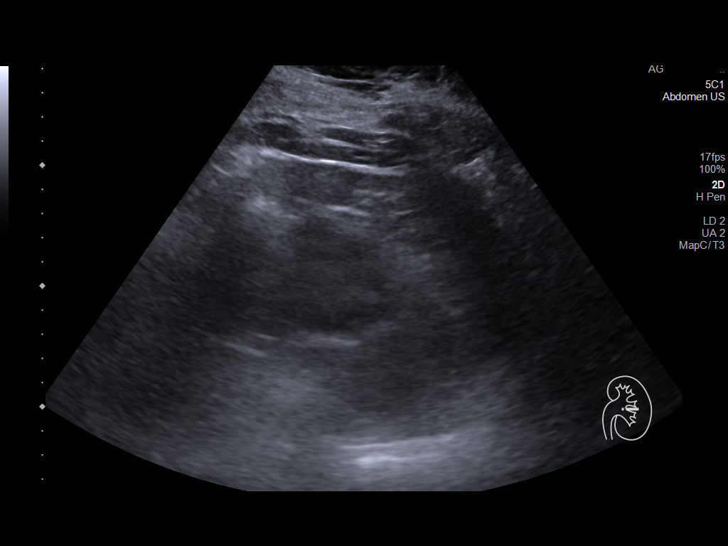
[im 90/99]
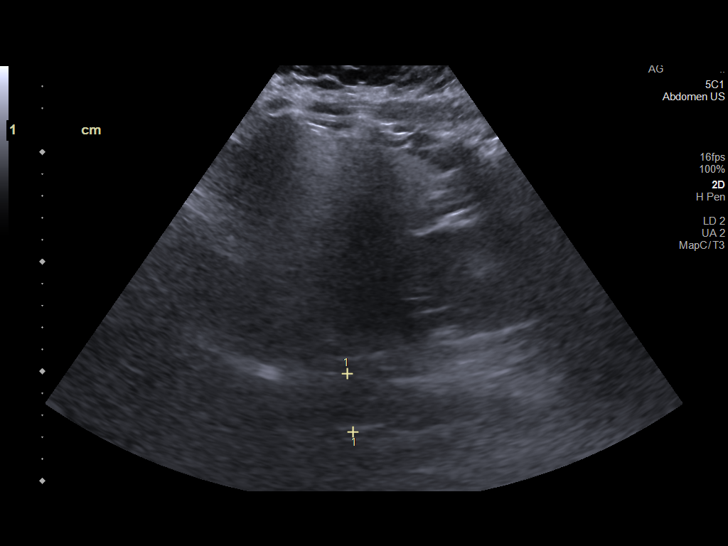
[im 99/99]
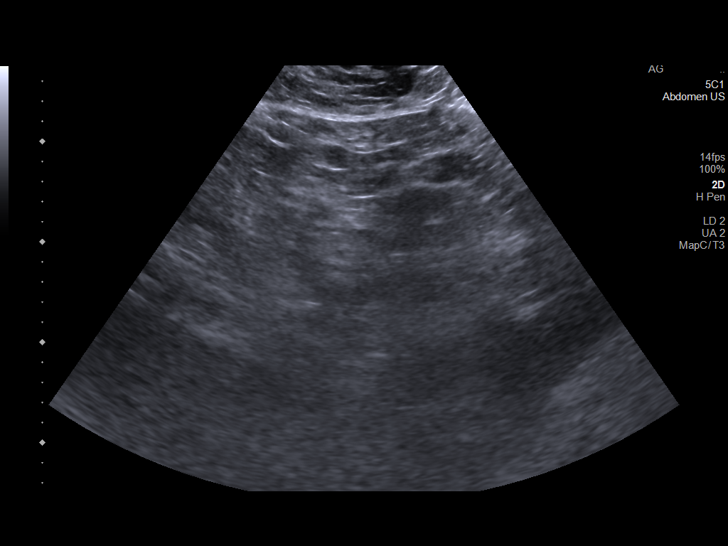

[13 of 25 positions shown; findings below may reference images not displayed]

FINDINGS: Gallbladder: Surgically absent.

Common bile duct: Diameter: 7 mm

Liver: No focal lesion identified. Increased diffusely coarsened
parenchymal echogenicity. Portal vein is patent on color Doppler
imaging with normal direction of blood flow towards the liver.

IVC: Assessment limited by bowel gas, no abnormality in the
visualized portions.

Pancreas: Assessment limited by bowel gas, no abnormality in the
visualized portions.

Spleen: Size and appearance within normal limits (7.5 cm).

Right Kidney: Length: 12.7 cm. Echogenicity within normal limits. No
mass or hydronephrosis visualized.

Left Kidney: Length: 14.7 cm. Echogenicity within normal limits. No
mass or hydronephrosis visualized.

Abdominal aorta: Assessment limited by bowel gas, no aneurysm in the
visualized portions.

Other findings: Exam is slightly limited by patient's habitus and
extensive bowel gas.
IMPRESSION: 1. Prior cholecystectomy.
2. The echogenicity of the liver is increased. This is a nonspecific
finding but is most commonly seen with hepatic steatosis, which can
be a cause of abdominal pain. There are no obvious focal liver
lesions.

## 2022-09-24 ENCOUNTER — Other Ambulatory Visit: Payer: Self-pay

## 2022-09-24 ENCOUNTER — Emergency Department (HOSPITAL_COMMUNITY)
Admission: EM | Admit: 2022-09-24 | Discharge: 2022-09-24 | Disposition: A | Discharge status: Home / Self Care | Payer: 59 | Attending: Emergency Medicine | Admitting: Emergency Medicine

## 2022-09-24 DIAGNOSIS — I10 Essential (primary) hypertension: Secondary | ICD-10-CM | POA: Insufficient documentation

## 2022-09-24 DIAGNOSIS — K0889 Other specified disorders of teeth and supporting structures: Secondary | ICD-10-CM | POA: Diagnosis not present

## 2022-09-24 MED ORDER — NAPROXEN 500 MG PO TABS
500.0000 mg | ORAL_TABLET | Freq: Two times a day (BID) | ORAL | 0 refills | Status: DC
Start: 2022-09-24 — End: 2023-07-16

## 2022-09-24 MED ORDER — BUPIVACAINE HCL (PF) 0.5 % IJ SOLN
10.0000 mL | Freq: Once | INTRAMUSCULAR | Status: AC
  Administered 2022-09-24: 10 mL
  Filled 2022-09-24: qty 30

## 2022-09-24 MED ORDER — OXYCODONE-ACETAMINOPHEN 5-325 MG PO TABS: 1.0000 | ORAL_TABLET | Freq: Four times a day (QID) | ORAL | 0 refills | Status: DC | PRN

## 2022-09-24 NOTE — ED Provider Notes (Signed)
[facility] Emergency Department Provider Note MRN:  [id number]  Arrival date & time: 09/24/22     Chief Complaint   Dental Pain   History of Present Illness   Courtney [name] is a 46 y.o. year-old female with a history of hypertension presenting to the ED with chief complaint of dental pain.  Worsening dental pain over the past few days.  Was told she needs a root canal.  Has been on antibiotics for several days.  Pain much worse this evening, cannot sleep.  No fever, no other complaints.  Had a tooth removed a week ago.  Review of Systems  A thorough review of systems was obtained and all systems are negative except as noted in the HPI and PMH.   Patient's Health History    Past Medical History:  Diagnosis Date   Hypertension    Kidney stones    Pregnancy induced hypertension    Urinary tract infection     Past Surgical History:  Procedure Laterality Date   CHOLECYSTECTOMY     KNEE ARTHROSCOPY     TONSILLECTOMY      No family history on file.  Social History   Socioeconomic History   Marital status: Married    Spouse name: Not on file   Number of children: Not on file   Years of education: Not on file   Highest education level: Not on file  Occupational History   Not on file  Tobacco Use   Smoking status: Never   Smokeless tobacco: Never  Substance and Sexual Activity   Alcohol use: No   Drug use: No   Sexual activity: Yes    Birth control/protection: None  Other Topics Concern   Not on file  Social History Narrative   Not on file   Social Determinants of Health   Financial Resource Strain: Not on file  Food Insecurity: Not on file  Transportation Needs: Not on file  Physical Activity: Not on file  Stress: Not on file  Social Connections: Not on file  Intimate Partner Violence: Not on file     Physical Exam   Vitals:   09/24/22 0050 09/24/22 0055  BP: (!) 161/81   Pulse: 69 71  Resp: 19   Temp:    SpO2: 99% 99%     CONSTITUTIONAL: Well-appearing, NAD NEURO/PSYCH:  Alert and oriented x 3, no focal deficits EYES:  eyes equal and reactive ENT/NECK:  no LAD, no JVD CARDIO: Regular rate, well-perfused, normal S1 and S2 PULM:  CTAB no wheezing or rhonchi GI/GU:  non-distended, non-tender MSK/SPINE:  No gross deformities, no edema SKIN:  no rash, atraumatic   *Additional and/or pertinent findings included in MDM below  Diagnostic and Interventional Summary    EKG Interpretation  Date/Time:    Ventricular Rate:    PR Interval:    QRS Duration:   QT Interval:    QTC Calculation:   R Axis:     Text Interpretation:         Labs Reviewed - No data to display  No orders to display    Medications  bupivacaine(PF) (MARCAINE) 0.5 % injection 10 mL (10 mLs Infiltration Given 09/24/22 0104)     Procedures  /  Critical Care .Nerve Block  Date/Time: 09/24/2022 1:25 AM  Performed by: Maudie Flakes, MD Authorized by: Maudie Flakes, MD   Consent:    Consent obtained:  Verbal   Consent given by:  Patient   Risks, benefits, and  alternatives were discussed: yes     Risks discussed:  Allergic reaction, bleeding, intravenous injection, infection, nerve damage, unsuccessful block and swelling   Alternatives discussed:  No treatment Universal protocol:    Procedure explained and questions answered to patient or proxy's satisfaction: yes     Immediately prior to procedure, a time out was called: yes     Patient identity confirmed:  Verbally with patient Indications:    Indications:  Pain relief Location:    Nerve block body site: Dental block.   Laterality:  Left Procedure details:    Block needle gauge:  27 G   Anesthetic injected:  Bupivacaine 0.5% w/o epi   Injection procedure:  Anatomic landmarks identified, anatomic landmarks palpated and incremental injection   Paresthesia:  None Post-procedure details:    Dressing:  None   Outcome:  Pain relieved   Procedure completion:   Tolerated well, no immediate complications   ED Course and Medical Decision Making  Initial Impression and Ddx Tender left lower molar, no signs of abscess or contiguous infection.  I see no evidence of dry socket.  Mostly needs pain control and dentistry follow-up.  Patient elects for dental nerve block.  Past medical/surgical history that increases complexity of ED encounter: Hypertension  Interpretation of Diagnostics Laboratory and/or imaging options to aid in the diagnosis/care of the patient were considered.  After careful history and physical examination, it was determined that there was no indication for diagnostics at this time.  Patient Reassessment and Ultimate Disposition/Management     Discharge  Patient management required discussion with the following services or consulting groups:  None  Complexity of Problems Addressed Acute complicated illness or Injury  Additional Data Reviewed and Analyzed Further history obtained from: Further history from spouse/family member  Additional Factors Impacting ED Encounter Risk Prescriptions  Barth Kirks. Sedonia Small, MD [place] [email or web address]  Final Clinical Impressions(s) / ED Diagnoses     ICD-10-CM   1. Pain, dental  K08.89       ED Discharge Orders          Ordered    naproxen (NAPROSYN) 500 MG tablet  2 times daily        09/24/22 0124    oxyCODONE-acetaminophen (PERCOCET/ROXICET) 5-325 MG tablet  Every 6 hours PRN        09/24/22 0124             Discharge Instructions Discussed with and Provided to Patient:    Discharge Instructions      You were evaluated in the Emergency Department and after careful evaluation, we did not find any emergent condition requiring admission or further testing in the hospital.  Your exam/testing today is overall reassuring.  Continue the antibiotics, use the Naprosyn twice daily for pain.  Use the oxycodone for more  significant pain keeping you from sleeping.  Very importantly follow-up with a dentist.  Please return to the Emergency Department if you experience any worsening of your condition.   Thank you for allowing [place] to be a part of your care.      Maudie Flakes, MD 09/24/22 [phone]

## 2022-09-24 NOTE — Discharge Instructions (Signed)
You were evaluated in the Emergency Department and after careful evaluation, we did not find any emergent condition requiring admission or further testing in the hospital.  Your exam/testing today is overall reassuring.  Continue the antibiotics, use the Naprosyn twice daily for pain.  Use the oxycodone for more significant pain keeping you from sleeping.  Very importantly follow-up with a dentist.  Please return to the Emergency Department if you experience any worsening of your condition.   Thank you for allowing [place] to be a part of your care.

## 2022-09-24 NOTE — ED Triage Notes (Signed)
Pt c/o dental pain, lower back molar. Saw a dentist on Sunday and was told she needed a root canal. Pt states she was given rx for amoxicillin and '600mg'$  motrin but cannot handle the pain.

## 2022-09-25 MED FILL — Oxycodone w/ Acetaminophen Tab 5-325 MG: ORAL | Qty: 6 | Status: AC

## 2023-04-10 DIAGNOSIS — N200 Calculus of kidney: Secondary | ICD-10-CM | POA: Diagnosis not present

## 2023-04-14 DIAGNOSIS — N200 Calculus of kidney: Secondary | ICD-10-CM | POA: Diagnosis not present

## 2023-04-17 DIAGNOSIS — R1084 Generalized abdominal pain: Secondary | ICD-10-CM | POA: Diagnosis not present

## 2023-04-17 DIAGNOSIS — N200 Calculus of kidney: Secondary | ICD-10-CM | POA: Diagnosis not present

## 2023-07-01 DIAGNOSIS — Z1329 Encounter for screening for other suspected endocrine disorder: Secondary | ICD-10-CM | POA: Diagnosis not present

## 2023-07-01 DIAGNOSIS — N920 Excessive and frequent menstruation with regular cycle: Secondary | ICD-10-CM | POA: Diagnosis not present

## 2023-07-01 DIAGNOSIS — Z130 Encounter for screening for diseases of the blood and blood-forming organs and certain disorders involving the immune mechanism: Secondary | ICD-10-CM | POA: Diagnosis not present

## 2023-07-01 DIAGNOSIS — Z131 Encounter for screening for diabetes mellitus: Secondary | ICD-10-CM | POA: Diagnosis not present

## 2023-07-01 DIAGNOSIS — Z01419 Encounter for gynecological examination (general) (routine) without abnormal findings: Secondary | ICD-10-CM | POA: Diagnosis not present

## 2023-07-01 DIAGNOSIS — Z6841 Body Mass Index (BMI) 40.0 and over, adult: Secondary | ICD-10-CM | POA: Diagnosis not present

## 2023-07-01 DIAGNOSIS — Z1321 Encounter for screening for nutritional disorder: Secondary | ICD-10-CM | POA: Diagnosis not present

## 2023-07-01 DIAGNOSIS — Z13228 Encounter for screening for other metabolic disorders: Secondary | ICD-10-CM | POA: Diagnosis not present

## 2023-07-09 DIAGNOSIS — E559 Vitamin D deficiency, unspecified: Secondary | ICD-10-CM | POA: Diagnosis not present

## 2023-07-09 DIAGNOSIS — K219 Gastro-esophageal reflux disease without esophagitis: Secondary | ICD-10-CM | POA: Diagnosis not present

## 2023-07-09 DIAGNOSIS — B9681 Helicobacter pylori [H. pylori] as the cause of diseases classified elsewhere: Secondary | ICD-10-CM | POA: Diagnosis not present

## 2023-07-09 DIAGNOSIS — I10 Essential (primary) hypertension: Secondary | ICD-10-CM | POA: Diagnosis not present

## 2023-07-09 DIAGNOSIS — R1010 Upper abdominal pain, unspecified: Secondary | ICD-10-CM | POA: Diagnosis not present

## 2023-07-09 DIAGNOSIS — R140 Abdominal distension (gaseous): Secondary | ICD-10-CM | POA: Diagnosis not present

## 2023-07-09 DIAGNOSIS — D509 Iron deficiency anemia, unspecified: Secondary | ICD-10-CM | POA: Diagnosis not present

## 2023-07-09 DIAGNOSIS — E118 Type 2 diabetes mellitus with unspecified complications: Secondary | ICD-10-CM | POA: Diagnosis not present

## 2023-07-09 DIAGNOSIS — Z6841 Body Mass Index (BMI) 40.0 and over, adult: Secondary | ICD-10-CM | POA: Diagnosis not present

## 2023-07-16 ENCOUNTER — Encounter: Payer: Self-pay | Admitting: Internal Medicine

## 2023-07-16 ENCOUNTER — Encounter: Payer: Self-pay | Admitting: *Deleted

## 2023-07-16 ENCOUNTER — Ambulatory Visit: Payer: 59 | Admitting: Internal Medicine

## 2023-07-16 ENCOUNTER — Other Ambulatory Visit: Payer: Self-pay | Admitting: *Deleted

## 2023-07-16 VITALS — BP 109/71 | HR 73 | Temp 97.8°F | Ht 63.0 in | Wt 282.0 lb

## 2023-07-16 DIAGNOSIS — Z8619 Personal history of other infectious and parasitic diseases: Secondary | ICD-10-CM | POA: Diagnosis not present

## 2023-07-16 DIAGNOSIS — R140 Abdominal distension (gaseous): Secondary | ICD-10-CM

## 2023-07-16 DIAGNOSIS — G8929 Other chronic pain: Secondary | ICD-10-CM

## 2023-07-16 DIAGNOSIS — Z7189 Other specified counseling: Secondary | ICD-10-CM | POA: Diagnosis not present

## 2023-07-16 DIAGNOSIS — K219 Gastro-esophageal reflux disease without esophagitis: Secondary | ICD-10-CM | POA: Diagnosis not present

## 2023-07-16 DIAGNOSIS — Z1211 Encounter for screening for malignant neoplasm of colon: Secondary | ICD-10-CM

## 2023-07-16 DIAGNOSIS — R1013 Epigastric pain: Secondary | ICD-10-CM | POA: Diagnosis not present

## 2023-07-16 MED ORDER — PEG 3350-KCL-NA BICARB-NACL 420 G PO SOLR: 4000.0000 mL | Freq: Once | ORAL | 0 refills | Status: AC

## 2023-07-16 NOTE — Progress Notes (Signed)
Primary Care Physician:  Ladon Applebaum Primary Gastroenterologist:  Dr. Marletta Lor  Chief Complaint  Patient presents with   New Patient (Initial Visit)    Pt referred for GERD and heartburn    HPI:   Courtney [name] is a 47 y.o. female who presents to clinic today by referral from her PCP Terie Purser for evaluation.  Patient states in March 2024 she had worsening epigastric pain, heartburn.  Was treated for H. pylori.  Believes she may have had a blood test which showed positivity.  She did well for a while then had resurgence of her symptoms over the last month or so.  Currently on a second course of antibiotics with tetracycline metronidazole pantoprazole twice daily.  She has not had H. pylori stool antigen or breath testing done as she states it would be very difficult for her to stop her PPI x 10 days.  Notes chronic epigastric pain and abdominal bloating, worse after meals.  Believes she may have had an ulcer when she was younger.  No chronic NSAID use.  No melena or hematochezia.  Also has acid reflux and heartburn.  This is improved since increasing her pantoprazole to twice daily.  Symptoms mild, intermittent.  In regards to colon cancer screening, no previous colonoscopy.  No family history of colorectal malignancy.  She is interested in getting scheduled for colonoscopy today.  No unintentional weight loss.  Past Medical History:  Diagnosis Date   Hypertension    Kidney stones    Pregnancy induced hypertension    Urinary tract infection     Past Surgical History:  Procedure Laterality Date   CHOLECYSTECTOMY     KNEE ARTHROSCOPY     TONSILLECTOMY      Current Outpatient Medications  Medication Sig Dispense Refill   ferrous sulfate 325 (65 FE) MG tablet Take 325 mg by mouth daily with breakfast.     lisinopril (ZESTRIL) 20 MG tablet Take 20 mg by mouth daily.     metroNIDAZOLE (FLAGYL) 500 MG tablet Take 500 mg by mouth 4 (four) times daily.      pantoprazole (PROTONIX) 20 MG tablet Take 20 mg by mouth 2 (two) times daily.     tetracycline (SUMYCIN) 500 MG capsule Take 500 mg by mouth 4 (four) times daily.     Vitamin D, Ergocalciferol, (DRISDOL) 1.25 MG (50000 UNIT) CAPS capsule Take 50,000 Units by mouth every 7 (seven) days.     cephALEXin (KEFLEX) 500 MG capsule Take 1 capsule (500 mg total) by mouth 4 (four) times daily. 28 capsule 0   docusate sodium (COLACE) 100 MG capsule Take 100 mg by mouth 2 (two) times daily.     ibuprofen (ADVIL) 800 MG tablet Take 1 tablet (800 mg total) by mouth 3 (three) times daily. 21 tablet 0   naproxen (NAPROSYN) 500 MG tablet Take 1 tablet (500 mg total) by mouth 2 (two) times daily. 30 tablet 0   NIFEdipine (PROCARDIA XL/ADALAT-CC) 60 MG 24 hr tablet Take 60 mg by mouth daily.     ondansetron (ZOFRAN ODT) 4 MG disintegrating tablet 8mg  ODT q4 hours prn nausea 30 tablet 0   ondansetron (ZOFRAN) 4 MG tablet Take 1 tablet (4 mg total) by mouth every 8 (eight) hours as needed for nausea or vomiting. 4 tablet 0   oxyCODONE-acetaminophen (PERCOCET) 5-325 MG tablet Take 1 tablet by mouth every 4 (four) hours as needed. 20 tablet 0   oxyCODONE-acetaminophen (PERCOCET/ROXICET) 5-325 MG tablet Take 2  tablets by mouth every 4 (four) hours as needed for severe pain. 6 tablet 0   oxyCODONE-acetaminophen (PERCOCET/ROXICET) 5-325 MG tablet Take 1 tablet by mouth every 6 (six) hours as needed for severe pain. 6 tablet 0   Prenatal Vit-Fe Fumarate-FA (PRENATAL MULTIVITAMIN) TABS tablet Take 1 tablet by mouth daily at 12 noon.     tamsulosin (FLOMAX) 0.4 MG CAPS capsule Take 1 capsule (0.4 mg total) by mouth daily. 30 capsule 0   No current facility-administered medications for this visit.    Allergies as of 07/16/2023   (No Known Allergies)    No family history on file.  Social History   Socioeconomic History   Marital status: Married    Spouse name: Not on file   Number of children: Not on file   Years of  education: Not on file   Highest education level: Not on file  Occupational History   Not on file  Tobacco Use   Smoking status: Never   Smokeless tobacco: Never  Substance and Sexual Activity   Alcohol use: No   Drug use: No   Sexual activity: Yes    Birth control/protection: None  Other Topics Concern   Not on file  Social History Narrative   Not on file   Social Determinants of Health   Financial Resource Strain: Not on file  Food Insecurity: Not on file  Transportation Needs: Not on file  Physical Activity: Not on file  Stress: Not on file  Social Connections: Not on file  Intimate Partner Violence: Not on file    Subjective: Review of Systems  Constitutional:  Negative for chills and fever.  HENT:  Negative for congestion and hearing loss.   Eyes:  Negative for blurred vision and double vision.  Respiratory:  Negative for cough and shortness of breath.   Cardiovascular:  Negative for chest pain and palpitations.  Gastrointestinal:  Positive for abdominal pain and heartburn. Negative for blood in stool, constipation, diarrhea, melena and vomiting.  Genitourinary:  Negative for dysuria and urgency.  Musculoskeletal:  Negative for joint pain and myalgias.  Skin:  Negative for itching and rash.  Neurological:  Negative for dizziness and headaches.  Psychiatric/Behavioral:  Negative for depression. The patient is not nervous/anxious.        Objective: BP 109/71   Pulse 73   Temp 97.8 F (36.6 C)   Ht 5\' 3"  (1.6 m)   Wt 282 lb (127.9 kg)   LMP 07/15/2023   BMI 49.95 kg/m  Physical Exam Constitutional:      Appearance: Normal appearance. She is obese.  HENT:     Head: Normocephalic and atraumatic.  Eyes:     Extraocular Movements: Extraocular movements intact.     Conjunctiva/sclera: Conjunctivae normal.  Cardiovascular:     Rate and Rhythm: Normal rate and regular rhythm.  Pulmonary:     Effort: Pulmonary effort is normal.     Breath sounds: Normal  breath sounds.  Abdominal:     General: Bowel sounds are normal.     Palpations: Abdomen is soft.  Musculoskeletal:        General: No swelling. Normal range of motion.     Cervical back: Normal range of motion and neck supple.  Skin:    General: Skin is warm and dry.     Coloration: Skin is not jaundiced.  Neurological:     General: No focal deficit present.     Mental Status: She is alert and oriented to person,  place, and time.  Psychiatric:        Mood and Affect: Mood normal.        Behavior: Behavior normal.      Assessment: *Chronic GERD *History of H. pylori *Epigastric pain *Abdominal bloating *Colon cancer screening  Plan: Patient currently receiving antibiotics for H. pylori infection though she has not had testing for this that I can see.  She states it would be very difficult for her to stop her PPI for 10 days to undergo testing.  Also notes improvement after being treated in March.  Counseled to complete her entire course of antibiotics.  Will plan on upper endoscopy in 4 to 6 weeks to further evaluate peptic ulcer disease, esophagitis, gastritis, H. Pylori, duodenitis, or other. Will also evaluate for esophageal stricture, Schatzki's ring, esophageal web or other.   At the same time we will perform colonoscopy for colon cancer screening purposes  The risks including infection, bleed, or perforation as well as benefits, limitations, alternatives and imponderables have been reviewed with the patient. Potential for esophageal dilation, biopsy, etc. have also been reviewed.  Questions have been answered. All parties agreeable.  Continue on pantoprazole twice daily.  Will check blood work today to screen for celiac disease.  Thank you Terie Purser for the kind referral   07/16/2023 9:07 AM   Disclaimer: This note was dictated with voice recognition software. Similar sounding words can inadvertently be transcribed and may not be corrected upon review.

## 2023-07-16 NOTE — Patient Instructions (Signed)
We will schedule you for upper endoscopy to further evaluate your epigastric pain, bloating, acid reflux.  I will also take samples of your stomach to check for H. pylori.  Continue antibiotics until completion.  Will plan on doing this in 4 to 6 weeks.  At the same time we will perform colonoscopy for colon cancer screening purposes.  I am going to check blood work today at [facility] to screen you for celiac disease.  Continue on pantoprazole 40 mg twice daily.  It was very nice meeting you today.  Dr. Marletta Lor

## 2023-07-19 LAB — CELIAC DISEASE PANEL
Endomysial IgA: NEGATIVE
IgA/Immunoglobulin A, Serum: 552 mg/dL — ABNORMAL HIGH (ref 87–352)

## 2023-07-23 DIAGNOSIS — N924 Excessive bleeding in the premenopausal period: Secondary | ICD-10-CM | POA: Diagnosis not present

## 2023-07-30 DIAGNOSIS — Z1231 Encounter for screening mammogram for malignant neoplasm of breast: Secondary | ICD-10-CM | POA: Diagnosis not present

## 2023-08-19 ENCOUNTER — Ambulatory Visit: Payer: 59 | Admitting: Internal Medicine

## 2023-08-19 NOTE — Patient Instructions (Addendum)
Courtney [name]  08/19/2023     @PREFPERIOPPHARMACY @   Your procedure is scheduled on  08/24/2023.   Report to [facility] at  1130 A.M.   Call this number if you have problems the morning of surgery:  [phone]  If you experience any cold or flu symptoms such as cough, fever, chills, shortness of breath, etc. between now and your scheduled surgery, please notify us at the above number.   Remember:  Follow the diet and prep instructions given to you by the office.   You may drink clear liquids until 0930 am on 08/24/2023.    Clear liquids allowed are:                    Water, Carbonated beverages (diabetics please choose diet or no sugar options), Black Coffee Only (No creamer, milk or cream, including half & half and powdered creamer), and Clear Sports drink (No red color; diabetics please choose diet or no sugar options)    Take these medicines the morning of surgery with A SIP OF WATER                                        pantoprazole    Do not wear jewelry, make-up or nail polish, including gel polish,  artificial nails, or any other type of covering on natural nails (fingers and  toes).  Do not wear lotions, powders, or perfumes, or deodorant.  Do not shave 48 hours prior to surgery.  Men may shave face and neck.  Do not bring valuables to the hospital.  [facility] is not responsible for any belongings or valuables.  Contacts, dentures or bridgework may not be worn into surgery.  Leave your suitcase in the car.  After surgery it may be brought to your room.  For patients admitted to the hospital, discharge time will be determined by your treatment team.  Patients discharged the day of surgery will not be allowed to drive home and must have someone with them for 24 hours.    Special instructions:   DO NOT smoke tobacco or vape for 24 hours before your procedure.  Please read over the following fact sheets that you were given. Anesthesia Post-op  Instructions and Care and Recovery After Surgery        Upper Endoscopy, Adult, Care After After the procedure, it is common to have a sore throat. It is also common to have: Mild stomach pain or discomfort. Bloating. Nausea. Follow these instructions at home: The instructions below may help you care for yourself at home. Your health care provider may give you more instructions. If you have questions, ask your health care provider. If you were given a sedative during the procedure, it can affect you for several hours. Do not drive or operate machinery until your health care provider says that it is safe. If you will be going home right after the procedure, plan to have a responsible adult: Take you home from the hospital or clinic. You will not be allowed to drive. Care for you for the time you are told. Follow instructions from your health care provider about what you may eat and drink. Return to your normal activities as told by your health care provider. Ask your health care provider what activities are safe for you. Take over-the-counter and prescription medicines only  as told by your health care provider. Contact a health care provider if you: Have a sore throat that lasts longer than one day. Have trouble swallowing. Have a fever. Get help right away if you: Vomit blood or your vomit looks like coffee grounds. Have bloody, black, or tarry stools. Have a very bad sore throat or you cannot swallow. Have difficulty breathing or very bad pain in your chest or abdomen. These symptoms may be an emergency. Get help right away. Call 911. Do not wait to see if the symptoms will go away. Do not drive yourself to the hospital. Summary After the procedure, it is common to have a sore throat, mild stomach discomfort, bloating, and nausea. If you were given a sedative during the procedure, it can affect you for several hours. Do not drive until your health care provider says that it is  safe. Follow instructions from your health care provider about what you may eat and drink. Return to your normal activities as told by your health care provider. This information is not intended to replace advice given to you by your health care provider. Make sure you discuss any questions you have with your health care provider. Document Revised: 01/22/2022 Document Reviewed: 01/22/2022 Elsevier Patient Education  2024 Else[employer] Inc. Colonoscopy, Adult, Care After The following information offers guidance on how to care for yourself after your procedure. Your health care provider may also give you more specific instructions. If you have problems or questions, contact your health care provider. What can I expect after the procedure? After the procedure, it is common to have: A small amount of blood in your stool for 24 hours after the procedure. Some gas. Mild cramping or bloating of your abdomen. Follow these instructions at home: Eating and drinking  Drink enough fluid to keep your urine pale yellow. Follow instructions from your health care provider about eating or drinking restrictions. Resume your normal diet as told by your health care provider. Avoid heavy or fried foods that are hard to digest. Activity Rest as told by your health care provider. Avoid sitting for a long time without moving. Get up to take short walks every 1-2 hours. This is important to improve blood flow and breathing. Ask for help if you feel weak or unsteady. Return to your normal activities as told by your health care provider. Ask your health care provider what activities are safe for you. Managing cramping and bloating  Try walking around when you have cramps or feel bloated. If directed, apply heat to your abdomen as told by your health care provider. Use the heat source that your health care provider recommends, such as a moist heat pack or a heating pad. Place a towel between your skin and the heat  source. Leave the heat on for 20-30 minutes. Remove the heat if your skin turns bright red. This is especially important if you are unable to feel pain, heat, or cold. You have a greater risk of getting burned. General instructions If you were given a sedative during the procedure, it can affect you for several hours. Do not drive or operate machinery until your health care provider says that it is safe. For the first 24 hours after the procedure: Do not sign important documents. Do not drink alcohol. Do your regular daily activities at a slower pace than normal. Eat soft foods that are easy to digest. Take over-the-counter and prescription medicines only as told by your health care provider. Keep all follow-up visits.  This is important. Contact a health care provider if: You have blood in your stool 2-3 days after the procedure. Get help right away if: You have more than a small spotting of blood in your stool. You have large blood clots in your stool. You have swelling of your abdomen. You have nausea or vomiting. You have a fever. You have increasing pain in your abdomen that is not relieved with medicine. These symptoms may be an emergency. Get help right away. Call 911. Do not wait to see if the symptoms will go away. Do not drive yourself to the hospital. Summary After the procedure, it is common to have a small amount of blood in your stool. You may also have mild cramping and bloating of your abdomen. If you were given a sedative during the procedure, it can affect you for several hours. Do not drive or operate machinery until your health care provider says that it is safe. Get help right away if you have a lot of blood in your stool, nausea or vomiting, a fever, or increased pain in your abdomen. This information is not intended to replace advice given to you by your health care provider. Make sure you discuss any questions you have with your health care provider. Document  Revised: 11/25/2022 Document Reviewed: 06/05/2021 Elsevier Patient Education  2024 Elsevier Inc. Monitored Anesthesia Care, Care After The following information offers guidance on how to care for yourself after your procedure. Your health care provider may also give you more specific instructions. If you have problems or questions, contact your health care provider. What can I expect after the procedure? After the procedure, it is common to have: Tiredness. Little or no memory about what happened during or after the procedure. Impaired judgment when it comes to making decisions. Nausea or vomiting. Some trouble with balance. Follow these instructions at home: For the time period you were told by your health care provider:  Rest. Do not participate in activities where you could fall or become injured. Do not drive or use machinery. Do not drink alcohol. Do not take sleeping pills or medicines that cause drowsiness. Do not make important decisions or sign legal documents. Do not take care of children on your own. Medicines Take over-the-counter and prescription medicines only as told by your health care provider. If you were prescribed antibiotics, take them as told by your health care provider. Do not stop using the antibiotic even if you start to feel better. Eating and drinking Follow instructions from your health care provider about what you may eat and drink. Drink enough fluid to keep your urine pale yellow. If you vomit: Drink clear fluids slowly and in small amounts as you are able. Clear fluids include water, ice chips, low-calorie sports drinks, and fruit juice that has water added to it (diluted fruit juice). Eat light and bland foods in small amounts as you are able. These foods include bananas, applesauce, rice, lean meats, toast, and crackers. General instructions  Have a responsible adult stay with you for the time you are told. It is important to have someone help care  for you until you are awake and alert. If you have sleep apnea, surgery and some medicines can increase your risk for breathing problems. Follow instructions from your health care provider about wearing your sleep device: When you are sleeping. This includes during daytime naps. While taking prescription pain medicines, sleeping medicines, or medicines that make you drowsy. Do not use any products that contain  nicotine or tobacco. These products include cigarettes, chewing tobacco, and vaping devices, such as e-cigarettes. If you need help quitting, ask your health care provider. Contact a health care provider if: You feel nauseous or vomit every time you eat or drink. You feel light-headed. You are still sleepy or having trouble with balance after 24 hours. You get a rash. You have a fever. You have redness or swelling around the IV site. Get help right away if: You have trouble breathing. You have new confusion after you get home. These symptoms may be an emergency. Get help right away. Call 911. Do not wait to see if the symptoms will go away. Do not drive yourself to the hospital. This information is not intended to replace advice given to you by your health care provider. Make sure you discuss any questions you have with your health care provider. Document Revised: 03/10/2022 Document Reviewed: 03/10/2022 Elsevier Patient Education  2024 [employer].

## 2023-08-20 ENCOUNTER — Encounter (HOSPITAL_COMMUNITY)
Admission: RE | Admit: 2023-08-20 | Discharge: 2023-08-20 | Disposition: A | Discharge status: Home / Self Care | Payer: 59 | Source: Ambulatory Visit | Attending: Internal Medicine | Admitting: Internal Medicine

## 2023-08-20 ENCOUNTER — Encounter (HOSPITAL_COMMUNITY): Payer: Self-pay

## 2023-08-20 VITALS — BP 129/81 | HR 100 | Temp 97.8°F | Resp 18 | Ht 63.0 in | Wt 282.0 lb

## 2023-08-20 DIAGNOSIS — D649 Anemia, unspecified: Secondary | ICD-10-CM | POA: Diagnosis not present

## 2023-08-20 DIAGNOSIS — Z01818 Encounter for other preprocedural examination: Secondary | ICD-10-CM | POA: Diagnosis not present

## 2023-08-20 DIAGNOSIS — R638 Other symptoms and signs concerning food and fluid intake: Secondary | ICD-10-CM | POA: Diagnosis not present

## 2023-08-20 LAB — CBC WITH DIFFERENTIAL/PLATELET
Abs Immature Granulocytes: 0.04 10*3/uL (ref 0.00–0.07)
Basophils Absolute: 0 10*3/uL (ref 0.0–0.1)
Basophils Relative: 0 %
Eosinophils Absolute: 0.3 10*3/uL (ref 0.0–0.5)
Eosinophils Relative: 3 %
HCT: 35.5 % — ABNORMAL LOW (ref 36.0–46.0)
Hemoglobin: 11.9 g/dL — ABNORMAL LOW (ref 12.0–15.0)
Immature Granulocytes: 0 %
Lymphocytes Relative: 23 %
Lymphs Abs: 2.2 10*3/uL (ref 0.7–4.0)
MCH: 30.3 pg (ref 26.0–34.0)
MCHC: 33.5 g/dL (ref 30.0–36.0)
MCV: 90.3 fL (ref 80.0–100.0)
Monocytes Absolute: 0.5 10*3/uL (ref 0.1–1.0)
Monocytes Relative: 5 %
Neutro Abs: 6.8 10*3/uL (ref 1.7–7.7)
Neutrophils Relative %: 69 %
Platelets: 372 10*3/uL (ref 150–400)
RBC: 3.93 MIL/uL (ref 3.87–5.11)
RDW: 12.6 % (ref 11.5–15.5)
WBC: 10 10*3/uL (ref 4.0–10.5)
nRBC: 0 % (ref 0.0–0.2)

## 2023-08-20 LAB — POCT PREGNANCY, URINE: Preg Test, Ur: NEGATIVE

## 2023-08-24 ENCOUNTER — Encounter (HOSPITAL_COMMUNITY)
Admission: RE | Disposition: A | Discharge status: Home / Self Care | Payer: Self-pay | Source: Home / Self Care | Attending: Internal Medicine

## 2023-08-24 ENCOUNTER — Ambulatory Visit (HOSPITAL_COMMUNITY)
Admission: RE | Admit: 2023-08-24 | Discharge: 2023-08-24 | Disposition: A | Discharge status: Home / Self Care | Payer: 59 | Attending: Internal Medicine | Admitting: Internal Medicine

## 2023-08-24 ENCOUNTER — Encounter (HOSPITAL_COMMUNITY): Payer: Self-pay

## 2023-08-24 ENCOUNTER — Ambulatory Visit (HOSPITAL_COMMUNITY): Payer: 59 | Admitting: Certified Registered"

## 2023-08-24 ENCOUNTER — Other Ambulatory Visit: Payer: Self-pay

## 2023-08-24 ENCOUNTER — Ambulatory Visit (HOSPITAL_BASED_OUTPATIENT_CLINIC_OR_DEPARTMENT_OTHER): Payer: 59 | Admitting: Certified Registered"

## 2023-08-24 DIAGNOSIS — Z8619 Personal history of other infectious and parasitic diseases: Secondary | ICD-10-CM | POA: Diagnosis not present

## 2023-08-24 DIAGNOSIS — K648 Other hemorrhoids: Secondary | ICD-10-CM | POA: Insufficient documentation

## 2023-08-24 DIAGNOSIS — K2950 Unspecified chronic gastritis without bleeding: Secondary | ICD-10-CM | POA: Diagnosis not present

## 2023-08-24 DIAGNOSIS — N189 Chronic kidney disease, unspecified: Secondary | ICD-10-CM

## 2023-08-24 DIAGNOSIS — I10 Essential (primary) hypertension: Secondary | ICD-10-CM | POA: Diagnosis not present

## 2023-08-24 DIAGNOSIS — Z139 Encounter for screening, unspecified: Secondary | ICD-10-CM | POA: Diagnosis not present

## 2023-08-24 DIAGNOSIS — K319 Disease of stomach and duodenum, unspecified: Secondary | ICD-10-CM

## 2023-08-24 DIAGNOSIS — Z1211 Encounter for screening for malignant neoplasm of colon: Secondary | ICD-10-CM | POA: Insufficient documentation

## 2023-08-24 DIAGNOSIS — K635 Polyp of colon: Secondary | ICD-10-CM

## 2023-08-24 DIAGNOSIS — K219 Gastro-esophageal reflux disease without esophagitis: Secondary | ICD-10-CM | POA: Insufficient documentation

## 2023-08-24 DIAGNOSIS — R1013 Epigastric pain: Secondary | ICD-10-CM | POA: Insufficient documentation

## 2023-08-24 DIAGNOSIS — D123 Benign neoplasm of transverse colon: Secondary | ICD-10-CM | POA: Insufficient documentation

## 2023-08-24 DIAGNOSIS — I129 Hypertensive chronic kidney disease with stage 1 through stage 4 chronic kidney disease, or unspecified chronic kidney disease: Secondary | ICD-10-CM | POA: Diagnosis not present

## 2023-08-24 DIAGNOSIS — K3189 Other diseases of stomach and duodenum: Secondary | ICD-10-CM | POA: Diagnosis not present

## 2023-08-24 DIAGNOSIS — K2970 Gastritis, unspecified, without bleeding: Secondary | ICD-10-CM

## 2023-08-24 DIAGNOSIS — K644 Residual hemorrhoidal skin tags: Secondary | ICD-10-CM

## 2023-08-24 DIAGNOSIS — Z6841 Body Mass Index (BMI) 40.0 and over, adult: Secondary | ICD-10-CM | POA: Insufficient documentation

## 2023-08-24 HISTORY — PX: POLYPECTOMY: SHX5525

## 2023-08-24 HISTORY — PX: ESOPHAGOGASTRODUODENOSCOPY (EGD) WITH PROPOFOL: SHX5813

## 2023-08-24 HISTORY — PX: BIOPSY: SHX5522

## 2023-08-24 HISTORY — PX: COLONOSCOPY WITH PROPOFOL: SHX5780

## 2023-08-24 HISTORY — DX: Other complications of anesthesia, initial encounter: T88.59XA

## 2023-08-24 SURGERY — COLONOSCOPY WITH PROPOFOL
Anesthesia: General

## 2023-08-24 MED ORDER — CHLORHEXIDINE GLUCONATE CLOTH 2 % EX PADS: 6.0000 | MEDICATED_PAD | Freq: Once | CUTANEOUS | Status: DC

## 2023-08-24 MED ORDER — PROPOFOL 500 MG/50ML IV EMUL
INTRAVENOUS | Status: DC | PRN
  Administered 2023-08-24: 150 ug/kg/min via INTRAVENOUS

## 2023-08-24 MED ORDER — LACTATED RINGERS IV SOLN: INTRAVENOUS | Status: DC | PRN

## 2023-08-24 MED ORDER — STERILE WATER FOR IRRIGATION IR SOLN
Status: DC | PRN
  Administered 2023-08-24: 60 mL

## 2023-08-24 MED ORDER — LIDOCAINE HCL (CARDIAC) PF 100 MG/5ML IV SOSY
PREFILLED_SYRINGE | INTRAVENOUS | Status: DC | PRN
  Administered 2023-08-24: 50 mg via INTRAVENOUS

## 2023-08-24 MED ORDER — PROPOFOL 10 MG/ML IV BOLUS
INTRAVENOUS | Status: DC | PRN
  Administered 2023-08-24: 40 mg via INTRAVENOUS
  Administered 2023-08-24: 100 mg via INTRAVENOUS
  Administered 2023-08-24: 50 mg via INTRAVENOUS
  Administered 2023-08-24: 40 mg via INTRAVENOUS
  Administered 2023-08-24: 30 mg via INTRAVENOUS
  Administered 2023-08-24: 50 mg via INTRAVENOUS
  Administered 2023-08-24: 30 mg via INTRAVENOUS
  Administered 2023-08-24: 50 mg via INTRAVENOUS

## 2023-08-24 NOTE — Op Note (Addendum)
[facility] Patient Name: Courtney [name] Procedure Date: 08/24/2023 12:51 PM MRN: [id number] Date of Birth: 02-06-1976 Attending MD: Hennie Duos. Marletta Lor , [place], [id number] CSN: [id number] Age: 47 Admit Type: Outpatient Procedure:                Upper GI endoscopy Indications:              Epigastric abdominal pain, Heartburn Providers:                Hennie Duos. Marletta Lor, DO, Emilee Tubb RN, RN, [employer], [employer]. Jessee Avers, Technician Referring MD:              Medicines:                See the Anesthesia note for documentation of the                            administered medications Complications:            No immediate complications. Estimated Blood Loss:     Estimated blood loss was minimal. Procedure:                Pre-Anesthesia Assessment:                           - The anesthesia plan was to use monitored                            anesthesia care (MAC).                           After obtaining informed consent, the endoscope was                            passed under direct vision. Throughout the                            procedure, the patient's blood pressure, pulse, and                            oxygen saturations were monitored continuously. The                            [id number]-H190 ([id number]) scope was introduced through the                            mouth, and advanced to the second part of duodenum.                            The upper GI endoscopy was accomplished without                            difficulty. The patient tolerated the procedure  well. Scope In: 1:07:04 PM Scope Out: 1:11:01 PM Total Procedure Duration: 0 hours 3 minutes 57 seconds  Findings:      The Z-line was regular and was found 38 cm from the incisors.      Diffuse mild inflammation characterized by erythema was found in the       entire examined stomach. Biopsies were taken with a cold forceps for       Helicobacter pylori  testing.      The duodenal bulb, first portion of the duodenum and second portion of       the duodenum were normal. Impression:               - Z-line regular, 38 cm from the incisors.                           - Gastritis. Biopsied.                           - Normal duodenal bulb, first portion of the                            duodenum and second portion of the duodenum. Moderate Sedation:      Per Anesthesia Care Recommendation:           - Patient has a contact number available for                            emergencies. The signs and symptoms of potential                            delayed complications were discussed with the                            patient. Return to normal activities tomorrow.                            Written discharge instructions were provided to the                            patient.                           - Resume previous diet.                           - Continue present medications.                           - Await pathology results.                           - Use a proton pump inhibitor PO BID.                           - No ibuprofen, naproxen, or other non-steroidal  anti-inflammatory drugs.                           - Return to GI clinic in 3 months. Procedure Code(s):        --- Professional ---                           [phone], Esophagogastroduodenoscopy, flexible,                            transoral; with biopsy, single or multiple Diagnosis Code(s):        --- Professional ---                           K29.70, Gastritis, unspecified, without bleeding                           R10.13, Epigastric pain                           R12, Heartburn CPT copyright 2022 American Medical Association. All rights reserved. The codes documented in this report are preliminary and upon coder review may  be revised to meet current compliance requirements. Hennie Duos. Marletta Lor, DO Hennie Duos. Marletta Lor, DO 08/24/2023 1:12:47  PM This report has been signed electronically. Number of Addenda: 0

## 2023-08-24 NOTE — Op Note (Signed)
[facility] Patient Name: Courtney [name] Procedure Date: 08/24/2023 1:14 PM MRN: [id number] Date of Birth: 1975-11-08 Attending MD: Hennie Duos. Marletta Lor , [place], [id number] CSN: [id number] Age: 47 Admit Type: Outpatient Procedure:                Colonoscopy Indications:              Screening for colorectal malignant neoplasm Providers:                Hennie Duos. Marletta Lor, DO, Crystal Page, Francoise Ceo                            RN, RN, Barkley Bruns L. Jessee Avers, Technician Referring MD:              Medicines:                See the Anesthesia note for documentation of the                            administered medications Complications:            No immediate complications. Estimated Blood Loss:     Estimated blood loss was minimal. Procedure:                Pre-Anesthesia Assessment:                           - The anesthesia plan was to use monitored                            anesthesia care (MAC).                           After obtaining informed consent, the colonoscope                            was passed under direct vision. Throughout the                            procedure, the patient's blood pressure, pulse, and                            oxygen saturations were monitored continuously. The                            PCF-HQ190L ([id number]) was introduced through the                            anus and advanced to the the cecum, identified by                            appendiceal orifice and ileocecal valve. The                            colonoscopy was performed without difficulty. The                            patient  tolerated the procedure well. The quality                            of the bowel preparation was evaluated using the                            BBPS [facility] Bowel Preparation Scale) with scores                            of: Right Colon = 3 (entire mucosa seen well with                            no residual staining, small fragments of stool or                             opaque liquid), Transverse Colon = 2 (minor amount                            of residual staining, small fragments of stool                            and/or opaque liquid, but mucosa seen well) and                            Left Colon = 2 (minor amount of residual staining,                            small fragments of stool and/or opaque liquid, but                            mucosa seen well). The total BBPS score equals 7.                            The quality of the bowel preparation was good. Scope In: 1:15:34 PM Scope Out: 1:35:03 PM Scope Withdrawal Time: 0 hours 17 minutes 47 seconds  Total Procedure Duration: 0 hours 19 minutes 29 seconds  Findings:      Hemorrhoids were found on perianal exam.      Non-bleeding internal hemorrhoids were found during endoscopy.      A 6 mm polyp was found in the transverse colon. The polyp was sessile.       The polyp was removed with a cold snare. Resection and retrieval were       complete.      The exam was otherwise without abnormality. Impression:               - Hemorrhoids found on perianal exam.                           - Non-bleeding internal hemorrhoids.                           - One 6 mm polyp in the transverse colon, removed  with a cold snare. Resected and retrieved.                           - The examination was otherwise normal. Moderate Sedation:      Per Anesthesia Care Recommendation:           - Patient has a contact number available for                            emergencies. The signs and symptoms of potential                            delayed complications were discussed with the                            patient. Return to normal activities tomorrow.                            Written discharge instructions were provided to the                            patient.                           - Resume previous diet.                           - Continue present medications.                            - Await pathology results.                           - Repeat colonoscopy in 7 years for surveillance.                           - Return to GI clinic in 3 months. Procedure Code(s):        --- Professional ---                           [phone], Colonoscopy, flexible; with removal of                            tumor(s), polyp(s), or other lesion(s) by snare                            technique Diagnosis Code(s):        --- Professional ---                           Z12.11, Encounter for screening for malignant                            neoplasm of colon                           D12.3, Benign neoplasm of transverse  colon (hepatic                            flexure or splenic flexure)                           K64.8, Other hemorrhoids CPT copyright 2022 American Medical Association. All rights reserved. The codes documented in this report are preliminary and upon coder review may  be revised to meet current compliance requirements. Hennie Duos. Marletta Lor, DO Hennie Duos. Marletta Lor, DO 08/24/2023 1:38:40 PM This report has been signed electronically. Number of Addenda: 0

## 2023-08-24 NOTE — H&P (Signed)
Primary Care Physician:  Ladon Applebaum Primary Gastroenterologist:  Dr. Marletta Lor  Pre-Procedure History & Physical: HPI:  Courtney [name] is a 47 y.o. female is here for an upper endoscopy for epigastric pain, chronic GERD, hx of h pylori, and a colonoscopy to be performed for colon cancer screening purposes.  Past Medical History:  Diagnosis Date   Hypertension    Kidney stones    Pregnancy induced hypertension    Urinary tract infection     Past Surgical History:  Procedure Laterality Date   CHOLECYSTECTOMY     KNEE ARTHROSCOPY     TONSILLECTOMY      Prior to Admission medications   Medication Sig Start Date End Date Taking? Authorizing Provider  ferrous sulfate 325 (65 FE) MG tablet Take 325 mg by mouth daily with breakfast.    [provider]  lisinopril (ZESTRIL) 20 MG tablet Take 20 mg by mouth daily.    [provider]  metroNIDAZOLE (FLAGYL) 500 MG tablet Take 500 mg by mouth 4 (four) times daily.    [provider]  pantoprazole (PROTONIX) 20 MG tablet Take 40 mg by mouth 2 (two) times daily.    [provider]  tetracycline (SUMYCIN) 500 MG capsule Take 500 mg by mouth 4 (four) times daily.    [provider]  Vitamin D, Ergocalciferol, (DRISDOL) 1.25 MG (50000 UNIT) CAPS capsule Take 50,000 Units by mouth every 7 (seven) days.    [provider]    Allergies as of 07/16/2023   (No Known Allergies)    No family history on file.  Social History   Socioeconomic History   Marital status: Married    Spouse name: Not on file   Number of children: Not on file   Years of education: Not on file   Highest education level: Not on file  Occupational History   Not on file  Tobacco Use   Smoking status: Never   Smokeless tobacco: Never  Substance and Sexual Activity   Alcohol use: No   Drug use: No   Sexual activity: Yes    Birth control/protection: None  Other Topics Concern   Not on file  Social  History Narrative   Not on file   Social Determinants of Health   Financial Resource Strain: Not on file  Food Insecurity: Not on file  Transportation Needs: Not on file  Physical Activity: Not on file  Stress: Not on file  Social Connections: Not on file  Intimate Partner Violence: Not on file    Review of Systems: See HPI, otherwise negative ROS  Physical Exam: Vital signs in last 24 hours:     General:   Alert,  Well-developed, well-nourished, pleasant and cooperative in NAD Head:  Normocephalic and atraumatic. Eyes:  Sclera clear, no icterus.   Conjunctiva pink. Ears:  Normal auditory acuity. Nose:  No deformity, discharge,  or lesions. Msk:  Symmetrical without gross deformities. Normal posture. Extremities:  Without clubbing or edema. Neurologic:  Alert and  oriented x4;  grossly normal neurologically. Skin:  Intact without significant lesions or rashes. Psych:  Alert and cooperative. Normal mood and affect.  Impression/Plan: Courtney [name] is here for an upper endoscopy for epigastric pain, chronic GERD, hx of h pylori, and a colonoscopy to be performed for colon cancer screening purposes.  The risks of the procedure including infection, bleed, or perforation as well as benefits, limitations, alternatives and imponderables have been reviewed with the patient. Questions have been answered. All  parties agreeable.

## 2023-08-24 NOTE — Discharge Instructions (Signed)
EGD Discharge instructions Please read the instructions outlined below and refer to this sheet in the next few weeks. These discharge instructions provide you with general information on caring for yourself after you leave the hospital. Your doctor may also give you specific instructions. While your treatment has been planned according to the most current medical practices available, unavoidable complications occasionally occur. If you have any problems or questions after discharge, please call your doctor. ACTIVITY You may resume your regular activity but move at a slower pace for the next 24 hours.  Take frequent rest periods for the next 24 hours.  Walking will help expel (get rid of) the air and reduce the bloated feeling in your abdomen.  No driving for 24 hours (because of the anesthesia (medicine) used during the test).  You may shower.  Do not sign any important legal documents or operate any machinery for 24 hours (because of the anesthesia used during the test).  NUTRITION Drink plenty of fluids.  You may resume your normal diet.  Begin with a light meal and progress to your normal diet.  Avoid alcoholic beverages for 24 hours or as instructed by your caregiver.  MEDICATIONS You may resume your normal medications unless your caregiver tells you otherwise.  WHAT YOU CAN EXPECT TODAY You may experience abdominal discomfort such as a feeling of fullness or "gas" pains.  FOLLOW-UP Your doctor will discuss the results of your test with you.  SEEK IMMEDIATE MEDICAL ATTENTION IF ANY OF THE FOLLOWING OCCUR: Excessive nausea (feeling sick to your stomach) and/or vomiting.  Severe abdominal pain and distention (swelling).  Trouble swallowing.  Temperature over 101 F (37.8 C).  Rectal bleeding or vomiting of blood.      Colonoscopy Discharge Instructions  Read the instructions outlined below and refer to this sheet in the next few weeks. These discharge instructions provide you  with general information on caring for yourself after you leave the hospital. Your doctor may also give you specific instructions. While your treatment has been planned according to the most current medical practices available, unavoidable complications occasionally occur.   ACTIVITY You may resume your regular activity, but move at a slower pace for the next 24 hours.  Take frequent rest periods for the next 24 hours.  Walking will help get rid of the air and reduce the bloated feeling in your belly (abdomen).  No driving for 24 hours (because of the medicine (anesthesia) used during the test).   Do not sign any important legal documents or operate any machinery for 24 hours (because of the anesthesia used during the test).  NUTRITION Drink plenty of fluids.  You may resume your normal diet as instructed by your doctor.  Begin with a light meal and progress to your normal diet. Heavy or fried foods are harder to digest and may make you feel sick to your stomach (nauseated).  Avoid alcoholic beverages for 24 hours or as instructed.  MEDICATIONS You may resume your normal medications unless your doctor tells you otherwise.  WHAT YOU CAN EXPECT TODAY Some feelings of bloating in the abdomen.  Passage of more gas than usual.  Spotting of blood in your stool or on the toilet paper.  IF YOU HAD POLYPS REMOVED DURING THE COLONOSCOPY: No aspirin products for 7 days or as instructed.  No alcohol for 7 days or as instructed.  Eat a soft diet for the next 24 hours.  FINDING OUT THE RESULTS OF YOUR TEST Not all test results  are available during your visit. If your test results are not back during the visit, make an appointment with your caregiver to find out the results. Do not assume everything is normal if you have not heard from your caregiver or the medical facility. It is important for you to follow up on all of your test results.  SEEK IMMEDIATE MEDICAL ATTENTION IF: You have more than a  spotting of blood in your stool.  Your belly is swollen (abdominal distention).  You are nauseated or vomiting.  You have a temperature over 101.  You have abdominal pain or discomfort that is severe or gets worse throughout the day.   Your EGD revealed mild amount inflammation in your stomach.  I took biopsies of this to rule out H. pylori.  Await pathology results, my office will contact you. Esophagus and small bowel appeared normal.   Continue on pantoprazole 40 mg twice daily.   Your colonoscopy revealed 1 polyp(s) which I removed successfully. Await pathology results, my office will contact you. I recommend repeating colonoscopy in 7 years for surveillance purposes.   Follow up in GI office in 2-3 months.     I hope you have a great rest of your week!  Hennie Duos. Marletta Lor, D.O. Gastroenterology and Hepatology [facility]

## 2023-08-24 NOTE — Anesthesia Procedure Notes (Signed)
Date/Time: 08/24/2023 1:03 PM  Performed by: Julian Reil, CRNAPre-anesthesia Checklist: Patient identified, Emergency Drugs available, Suction available and Patient being monitored Patient Re-evaluated:Patient Re-evaluated prior to induction Oxygen Delivery Method: Nasal cannula Induction Type: IV induction Placement Confirmation: positive ETCO2

## 2023-08-24 NOTE — Anesthesia Postprocedure Evaluation (Signed)
Anesthesia Post Note  Patient: Chundra Lohn  Procedure(s) Performed: COLONOSCOPY WITH PROPOFOL ESOPHAGOGASTRODUODENOSCOPY (EGD) WITH PROPOFOL BIOPSY POLYPECTOMY  Patient location during evaluation: Phase II Anesthesia Type: General Level of consciousness: awake Pain management: pain level controlled Vital Signs Assessment: post-procedure vital signs reviewed and stable Respiratory status: spontaneous breathing and respiratory function stable Cardiovascular status: blood pressure returned to baseline and stable Postop Assessment: no headache and no apparent nausea or vomiting Anesthetic complications: no Comments: Late entry   No notable events documented.   Last Vitals:  Vitals:   08/24/23 1247 08/24/23 1340  BP: 134/61 (!) 99/54  Pulse: 83 94  Resp: (!) 22 18  Temp: 36.8 C 36.6 C  SpO2: 99% 100%    Last Pain:  Vitals:   08/24/23 1340  TempSrc: Oral  PainSc: 0-No pain                 Windell Norfolk

## 2023-08-24 NOTE — Transfer of Care (Signed)
Immediate Anesthesia Transfer of Care Note  Patient: Courtney [name]  Procedure(s) Performed: COLONOSCOPY WITH PROPOFOL ESOPHAGOGASTRODUODENOSCOPY (EGD) WITH PROPOFOL BIOPSY POLYPECTOMY  Patient Location: Short Stay  Anesthesia Type:General  Level of Consciousness: awake, alert , and oriented  Airway & Oxygen Therapy: Patient Spontanous Breathing  Post-op Assessment: Report given to RN and Post -op Vital signs reviewed and stable  Post vital signs: Reviewed and stable  Last Vitals:  Vitals Value Taken Time  BP    Temp    Pulse    Resp    SpO2      Last Pain:  Vitals:   08/24/23 1302  TempSrc:   PainSc: 0-No pain      Patients Stated Pain Goal: 8 (08/24/23 1247)  Complications: No notable events documented.

## 2023-08-24 NOTE — Anesthesia Preprocedure Evaluation (Signed)
Anesthesia Evaluation  Patient identified by MRN, date of birth, ID band Patient awake    Reviewed: Allergy & Precautions, H&P , NPO status , Patient's Chart, lab work & pertinent test results, reviewed documented beta blocker date and time   History of Anesthesia Complications (+) history of anesthetic complications  Airway Mallampati: II  TM Distance: >3 FB Neck ROM: full    Dental no notable dental hx.    Pulmonary neg pulmonary ROS   Pulmonary exam normal breath sounds clear to auscultation       Cardiovascular Exercise Tolerance: Good hypertension, negative cardio ROS  Rhythm:regular Rate:Normal     Neuro/Psych negative neurological ROS  negative psych ROS   GI/Hepatic negative GI ROS, Neg liver ROS,,,  Endo/Other  negative endocrine ROS  Morbid obesity  Renal/GU Renal diseasenegative Renal ROS  negative genitourinary   Musculoskeletal   Abdominal   Peds  Hematology negative hematology ROS (+)   Anesthesia Other Findings   Reproductive/Obstetrics negative OB ROS                             Anesthesia Physical Anesthesia Plan  ASA: 3  Anesthesia Plan: General   Post-op Pain Management:    Induction:   PONV Risk Score and Plan: Propofol infusion  Airway Management Planned:   Additional Equipment:   Intra-op Plan:   Post-operative Plan:   Informed Consent: I have reviewed the patients History and Physical, chart, labs and discussed the procedure including the risks, benefits and alternatives for the proposed anesthesia with the patient or authorized representative who has indicated his/her understanding and acceptance.     Dental Advisory Given  Plan Discussed with: CRNA  Anesthesia Plan Comments:        Anesthesia Quick Evaluation

## 2023-08-25 LAB — SURGICAL PATHOLOGY

## 2023-08-28 ENCOUNTER — Encounter (HOSPITAL_COMMUNITY): Payer: Self-pay | Admitting: Internal Medicine

## 2023-09-15 DIAGNOSIS — I10 Essential (primary) hypertension: Secondary | ICD-10-CM | POA: Diagnosis not present

## 2023-09-15 DIAGNOSIS — Z0001 Encounter for general adult medical examination with abnormal findings: Secondary | ICD-10-CM | POA: Diagnosis not present

## 2023-09-15 DIAGNOSIS — K219 Gastro-esophageal reflux disease without esophagitis: Secondary | ICD-10-CM | POA: Diagnosis not present

## 2023-09-15 DIAGNOSIS — R7309 Other abnormal glucose: Secondary | ICD-10-CM | POA: Diagnosis not present

## 2023-09-15 DIAGNOSIS — E559 Vitamin D deficiency, unspecified: Secondary | ICD-10-CM | POA: Diagnosis not present

## 2023-09-15 DIAGNOSIS — D509 Iron deficiency anemia, unspecified: Secondary | ICD-10-CM | POA: Diagnosis not present

## 2023-09-15 DIAGNOSIS — Z1331 Encounter for screening for depression: Secondary | ICD-10-CM | POA: Diagnosis not present

## 2023-09-15 DIAGNOSIS — Z6841 Body Mass Index (BMI) 40.0 and over, adult: Secondary | ICD-10-CM | POA: Diagnosis not present

## 2023-09-15 DIAGNOSIS — M2550 Pain in unspecified joint: Secondary | ICD-10-CM | POA: Diagnosis not present

## 2023-09-23 ENCOUNTER — Encounter (HOSPITAL_BASED_OUTPATIENT_CLINIC_OR_DEPARTMENT_OTHER): Payer: Self-pay | Admitting: Obstetrics and Gynecology

## 2023-09-23 NOTE — Progress Notes (Addendum)
Spoke w/ via phone for pre-op interview---Courtney [name] needs dos---- UPT per anesthesia, CBC,CMP,RPR,T&S and HIV per surgeon.   [name] results------Current EKG dated 08/20/23 at [facility]. COVID test -----patient states asymptomatic no test needed Arrive at -------0530 NPO after MN NO Solid Food.   Med rec completed Medications to take morning of surgery -----Protonix Diabetic medication -----NONE AM of surgery Patient instructed no nail polish to be worn day of surgery Patient instructed to bring photo id and insurance card day of surgery Patient aware to have Driver (ride ) / caregiver    for 24 hours after surgery - Husband Courtney [name] Patient Special Instructions ----- Pre-Op special Instructions ----- Patient verbalized understanding of instructions that were given at this phone interview. Patient denies chest pain, sob, fever, cough at the interview.

## 2023-10-02 ENCOUNTER — Ambulatory Visit: Payer: 59 | Attending: Internal Medicine | Admitting: Internal Medicine

## 2023-10-02 ENCOUNTER — Encounter: Payer: Self-pay | Admitting: Internal Medicine

## 2023-10-02 VITALS — BP 124/76 | HR 66 | Ht 64.0 in | Wt 278.4 lb

## 2023-10-02 DIAGNOSIS — M2550 Pain in unspecified joint: Secondary | ICD-10-CM | POA: Diagnosis not present

## 2023-10-02 DIAGNOSIS — Z8249 Family history of ischemic heart disease and other diseases of the circulatory system: Secondary | ICD-10-CM | POA: Insufficient documentation

## 2023-10-02 DIAGNOSIS — E785 Hyperlipidemia, unspecified: Secondary | ICD-10-CM | POA: Diagnosis not present

## 2023-10-02 DIAGNOSIS — R0789 Other chest pain: Secondary | ICD-10-CM | POA: Insufficient documentation

## 2023-10-02 DIAGNOSIS — Z136 Encounter for screening for cardiovascular disorders: Secondary | ICD-10-CM | POA: Diagnosis not present

## 2023-10-02 NOTE — H&P (Signed)
Courtney [name] is an 47 y.o. female. She has irregular heavy menses for over 1 year. U/S in office noted an 8mm mass suspicious for an EM polyp.  Pertinent Gynecological History: Menses: flow is excessive with use of many pads or tampons on heaviest days Bleeding: dysfunctional uterine bleeding Contraception: tubal ligation DES exposure: denies Blood transfusions: none Sexually transmitted diseases: no past history Previous GYN Procedures:  Last mammogram: normal Date: 2024 Last pap: normal Date: 2018 OB History: G3, P3   Menstrual History: Menarche age: unknown No LMP recorded (lmp unknown).    Past Medical History:  Diagnosis Date   Complication of anesthesia    Diabetes mellitus without complication (HCC)    GERD (gastroesophageal reflux disease)    Hypertension    Kidney stones    Pregnancy induced hypertension    Urinary tract infection     Past Surgical History:  Procedure Laterality Date   BIOPSY  08/24/2023   Procedure: BIOPSY;  Surgeon: Lanelle Bal, DO;  [facility];  Service: Endoscopy;;   CHOLECYSTECTOMY     COLONOSCOPY WITH PROPOFOL N/A 08/24/2023   Procedure: COLONOSCOPY WITH PROPOFOL;  Surgeon: Lanelle Bal, DO;  [facility];  Service: Endoscopy;  Laterality: N/A;  1:30 pm, asa 3   ESOPHAGOGASTRODUODENOSCOPY (EGD) WITH PROPOFOL N/A 08/24/2023   Procedure: ESOPHAGOGASTRODUODENOSCOPY (EGD) WITH PROPOFOL;  Surgeon: Lanelle Bal, DO;  [facility];  Service: Endoscopy;  Laterality: N/A;   KNEE ARTHROSCOPY     POLYPECTOMY  08/24/2023   Procedure: POLYPECTOMY;  Surgeon: Lanelle Bal, DO;  [facility];  Service: Endoscopy;;   TONSILLECTOMY      History reviewed. No pertinent family history.  Social History:  reports that she has never smoked. She has never used smokeless tobacco. She reports that she does not drink alcohol and does not use drugs.  Allergies: No Known Allergies  No medications  prior to admission.    Review of Systems  Constitutional:  Negative for fever.    Height 5\' 3"  (1.6 m), weight 124.7 kg, not currently breastfeeding. Physical Exam Cardiovascular:     Rate and Rhythm: Normal rate.  Pulmonary:     Effort: Pulmonary effort is normal.     No results found for this or any previous visit (from the past 24 hour(s)).  No results found.  Assessment/Plan: 47 yo G3P3 BTL with AUB and possible EM polyp H/S, D&C, possible Myosure resection discussed including risks such as infection, uterine perforation and organ damage, bleeding/transfusion-HIV/Hep, DVT/PE, pneumonia, persistent or recurrent AUB.   Roselle Locus II 10/02/2023, 12:15 PM

## 2023-10-02 NOTE — Progress Notes (Signed)
Cardiology Office Note  Date: 10/02/2023   ID: [name], DOB 1976-06-17, MRN [id number]  PCP:  Avis Epley, PA-C  Cardiologist:  Marjo Bicker, MD Electrophysiologist:  None   History of Present Illness: Courtney [name] is a 47 y.o. female known to have GERD, DM 2 was referred to cardiology clinic for evaluation of chest pain.  Patient had atypical chest pain similar to GERD, underwent EGD and was told to have gastritis.  She was started on PPI with significant improvement in her symptoms.  No angina.  No other symptoms of dizziness, syncope, SOB, palpitations or leg swelling.  She was having SOB previously but after losing weight, SOB resolved.  She has a lot on her plate now, she takes care of her 3 kids including 1 with special needs, her parents as well.  She does not have time to take care of herself.  She has a significant family history of CAD, father had CABG at the age of 34 years, paternal uncle had heart attack and PCI, and aunt passed away with heart attack.   Past Medical History:  Diagnosis Date   Complication of anesthesia    Diabetes mellitus without complication (HCC)    GERD (gastroesophageal reflux disease)    Hypertension    Kidney stones    Pregnancy induced hypertension    Urinary tract infection     Past Surgical History:  Procedure Laterality Date   BIOPSY  08/24/2023   Procedure: BIOPSY;  Surgeon: Lanelle Bal, DO;  [facility];  Service: Endoscopy;;   CHOLECYSTECTOMY     COLONOSCOPY WITH PROPOFOL N/A 08/24/2023   Procedure: COLONOSCOPY WITH PROPOFOL;  Surgeon: Lanelle Bal, DO;  [facility];  Service: Endoscopy;  Laterality: N/A;  1:30 pm, asa 3   ESOPHAGOGASTRODUODENOSCOPY (EGD) WITH PROPOFOL N/A 08/24/2023   Procedure: ESOPHAGOGASTRODUODENOSCOPY (EGD) WITH PROPOFOL;  Surgeon: Lanelle Bal, DO;  [facility];  Service: Endoscopy;  Laterality: N/A;   KNEE ARTHROSCOPY     POLYPECTOMY   08/24/2023   Procedure: POLYPECTOMY;  Surgeon: Lanelle Bal, DO;  [facility];  Service: Endoscopy;;   TONSILLECTOMY      Current Outpatient Medications  Medication Sig Dispense Refill   Cholecalciferol 1.25 MG (50000 UT) capsule Take 1 capsule every week by oral route.     ferrous sulfate 325 (65 FE) MG tablet Take 325 mg by mouth daily with breakfast.     lisinopril (ZESTRIL) 20 MG tablet Take 20 mg by mouth daily.     metFORMIN (GLUCOPHAGE) 500 MG tablet Take 500 mg by mouth 2 (two) times daily with a meal. Has not started     pantoprazole (PROTONIX) 20 MG tablet Take 40 mg by mouth 2 (two) times daily.     phentermine (ADIPEX-P) 37.5 MG tablet Take 1 tablet by mouth daily.     progesterone (PROMETRIUM) 200 MG capsule Take 200 mg by mouth daily.     rosuvastatin (CRESTOR) 10 MG tablet Take 10 mg by mouth daily.     Vitamin D, Ergocalciferol, (DRISDOL) 1.25 MG (50000 UNIT) CAPS capsule Take 50,000 Units by mouth every 7 (seven) days.     No current facility-administered medications for this visit.   Allergies:  Patient has no known allergies.   Social History: The patient  reports that she has never smoked. She has never used smokeless tobacco. She reports that she does not drink alcohol and does not use drugs.  Family History: The patient's family history is not on file.   ROS:  Please see the history of present illness. Otherwise, complete review of systems is positive for none  All other systems are reviewed and negative.   Physical Exam: VS:  BP 124/76   Pulse 66   Ht 5\' 4"  (1.626 m)   Wt 278 lb 6.4 oz (126.3 kg)   SpO2 99%   BMI 47.79 kg/m , BMI Body mass index is 47.79 kg/m.  Wt Readings from Last 3 Encounters:  10/02/23 278 lb 6.4 oz (126.3 kg)  08/24/23 281 lb 15.5 oz (127.9 kg)  08/20/23 281 lb 15.5 oz (127.9 kg)    General: Patient appears comfortable at rest. HEENT: Conjunctiva and lids normal, oropharynx clear with moist mucosa. Neck:  Supple, no elevated JVP or carotid bruits, no thyromegaly. Lungs: Clear to auscultation, nonlabored breathing at rest. Cardiac: Regular rate and rhythm, no S3 or significant systolic murmur, no pericardial rub. Abdomen: Soft, nontender, no hepatomegaly, bowel sounds present, no guarding or rebound. Extremities: No pitting edema, distal pulses 2+. Skin: Warm and dry. Musculoskeletal: No kyphosis. Neuropsychiatric: Alert and oriented x3, affect grossly appropriate.  Recent Labwork: 08/20/2023: Hemoglobin 11.9; Platelets 372  No results found for: "CHOL", "TRIG", "HDL", "CHOLHDL", "VLDL", "LDLCALC", "LDLDIRECT"   Assessment and Plan:   Noncardiac chest pain secondary to gastritis, resolved Family history of CAD HLD, not at goal Screening of CAD   -Chest pain is resolved after starting PPI, no angina. She has a family history CAD, father had CABG at the age of 84 she also has multiple other paternal and maternal family members that had PCI and passed away with heart attacks. Reviewed lipid panel that showed LDL 117, was recently started on Crestor. lipoprotein a levels. She will also benefit from CT calcium scoring test.  Strongly encouraged exercise for weight loss.   I spent 45 minutes reviewing prior procedure notes, reports, discussed the types of chest pain, educated patient's about symptoms of heart attack, prevention and management.  Ordered tests and documented the findings in the note.    Medication Adjustments/Labs and Tests Ordered: Current medicines are reviewed at length with the patient today.  Concerns regarding medicines are outlined above.    Disposition:  Follow up  pending results  Signed Elber Galyean Verne Spurr, MD, 10/02/2023 9:39 AM    [facility] [address] [place], [place], [place] [address]

## 2023-10-02 NOTE — Patient Instructions (Addendum)
Medication Instructions:  Your physician recommends that you continue on your current medications as directed. Please refer to the Current Medication list given to you today.   Labwork: Lipoprotein-a to be completed at [facility] in [place] today  Testing/Procedures: Your physician has requested that you have cardiac CT. Cardiac computed tomography (CT) is a painless test that uses an x-ray machine to take clear, detailed pictures of your heart. For further information please visit [email or web address] Please follow instruction sheet as given.    Follow-Up: Your physician recommends that you schedule a follow-up appointment in: Pending Results  Any Other Special Instructions Will Be Listed Below (If Applicable).  Thank you for choosing [facility]!      If you need a refill on your cardiac medications before your next appointment, please call your pharmacy.

## 2023-10-04 ENCOUNTER — Encounter (HOSPITAL_BASED_OUTPATIENT_CLINIC_OR_DEPARTMENT_OTHER): Payer: Self-pay | Admitting: Obstetrics and Gynecology

## 2023-10-04 NOTE — Anesthesia Preprocedure Evaluation (Signed)
Anesthesia Evaluation  Patient identified by MRN, date of birth, ID band Patient awake    Reviewed: Allergy & Precautions, NPO status , Patient's Chart, lab work & pertinent test results, reviewed documented beta blocker date and time   History of Anesthesia Complications (+) history of anesthetic complications  Airway Mallampati: II  TM Distance: >3 FB     Dental no notable dental hx. (+) Teeth Intact, Dental Advisory Given   Pulmonary neg pulmonary ROS   Pulmonary exam normal breath sounds clear to auscultation       Cardiovascular hypertension, Pt. on medications Normal cardiovascular exam Rhythm:Regular Rate:Normal     Neuro/Psych negative neurological ROS  negative psych ROS   GI/Hepatic Neg liver ROS,GERD  ,,  Endo/Other  diabetes, Well Controlled, Type 2, Oral Hypoglycemic Agents  Class 3 obesityHLD  Renal/GU Renal diseaseHx/o renal calculi   negative genitourinary   Musculoskeletal negative musculoskeletal ROS (+)    Abdominal  (+) + obese  Peds  Hematology negative hematology ROS (+)   Anesthesia Other Findings   Reproductive/Obstetrics Menorrhagia                              Anesthesia Physical Anesthesia Plan  ASA: 3  Anesthesia Plan: General   Post-op Pain Management: Minimal or no pain anticipated   Induction: Intravenous  PONV Risk Score and Plan: 4 or greater and Treatment may vary due to age or medical condition, Midazolam, Ondansetron and Dexamethasone  Airway Management Planned: LMA  Additional Equipment: None  Intra-op Plan:   Post-operative Plan: Extubation in OR  Informed Consent: I have reviewed the patients History and Physical, chart, labs and discussed the procedure including the risks, benefits and alternatives for the proposed anesthesia with the patient or authorized representative who has indicated his/her understanding and acceptance.      Dental advisory given  Plan Discussed with: Anesthesiologist and CRNA  Anesthesia Plan Comments:          Anesthesia Quick Evaluation

## 2023-10-05 ENCOUNTER — Other Ambulatory Visit: Payer: Self-pay

## 2023-10-05 ENCOUNTER — Encounter (HOSPITAL_BASED_OUTPATIENT_CLINIC_OR_DEPARTMENT_OTHER): Payer: Self-pay | Admitting: Obstetrics and Gynecology

## 2023-10-05 ENCOUNTER — Ambulatory Visit (HOSPITAL_BASED_OUTPATIENT_CLINIC_OR_DEPARTMENT_OTHER): Payer: 59 | Admitting: Anesthesiology

## 2023-10-05 ENCOUNTER — Encounter: Payer: Self-pay | Admitting: Gastroenterology

## 2023-10-05 ENCOUNTER — Ambulatory Visit (HOSPITAL_BASED_OUTPATIENT_CLINIC_OR_DEPARTMENT_OTHER)
Admission: RE | Admit: 2023-10-05 | Discharge: 2023-10-05 | Disposition: A | Discharge status: Home / Self Care | Payer: 59 | Attending: Obstetrics and Gynecology | Admitting: Obstetrics and Gynecology

## 2023-10-05 ENCOUNTER — Encounter (HOSPITAL_BASED_OUTPATIENT_CLINIC_OR_DEPARTMENT_OTHER)
Admission: RE | Disposition: A | Discharge status: Home / Self Care | Payer: Self-pay | Source: Home / Self Care | Attending: Obstetrics and Gynecology

## 2023-10-05 DIAGNOSIS — E785 Hyperlipidemia, unspecified: Secondary | ICD-10-CM | POA: Diagnosis not present

## 2023-10-05 DIAGNOSIS — Z6841 Body Mass Index (BMI) 40.0 and over, adult: Secondary | ICD-10-CM | POA: Insufficient documentation

## 2023-10-05 DIAGNOSIS — E1122 Type 2 diabetes mellitus with diabetic chronic kidney disease: Secondary | ICD-10-CM

## 2023-10-05 DIAGNOSIS — N920 Excessive and frequent menstruation with regular cycle: Secondary | ICD-10-CM | POA: Diagnosis not present

## 2023-10-05 DIAGNOSIS — Z79899 Other long term (current) drug therapy: Secondary | ICD-10-CM | POA: Insufficient documentation

## 2023-10-05 DIAGNOSIS — Z7984 Long term (current) use of oral hypoglycemic drugs: Secondary | ICD-10-CM | POA: Diagnosis not present

## 2023-10-05 DIAGNOSIS — Z01818 Encounter for other preprocedural examination: Secondary | ICD-10-CM

## 2023-10-05 DIAGNOSIS — Z794 Long term (current) use of insulin: Secondary | ICD-10-CM

## 2023-10-05 DIAGNOSIS — N921 Excessive and frequent menstruation with irregular cycle: Secondary | ICD-10-CM | POA: Insufficient documentation

## 2023-10-05 DIAGNOSIS — N189 Chronic kidney disease, unspecified: Secondary | ICD-10-CM

## 2023-10-05 DIAGNOSIS — I129 Hypertensive chronic kidney disease with stage 1 through stage 4 chronic kidney disease, or unspecified chronic kidney disease: Secondary | ICD-10-CM

## 2023-10-05 DIAGNOSIS — I10 Essential (primary) hypertension: Secondary | ICD-10-CM | POA: Diagnosis not present

## 2023-10-05 DIAGNOSIS — E119 Type 2 diabetes mellitus without complications: Secondary | ICD-10-CM | POA: Insufficient documentation

## 2023-10-05 HISTORY — PX: DILATATION & CURETTAGE/HYSTEROSCOPY WITH MYOSURE: SHX6511

## 2023-10-05 HISTORY — DX: Gastro-esophageal reflux disease without esophagitis: K21.9

## 2023-10-05 HISTORY — DX: Type 2 diabetes mellitus without complications: E11.9

## 2023-10-05 LAB — TYPE AND SCREEN
ABO/RH(D): AB POS
Antibody Screen: NEGATIVE

## 2023-10-05 LAB — CBC
HCT: 37.2 % (ref 36.0–46.0)
Hemoglobin: 12.5 g/dL (ref 12.0–15.0)
MCH: 29.9 pg (ref 26.0–34.0)
MCHC: 33.6 g/dL (ref 30.0–36.0)
MCV: 89 fL (ref 80.0–100.0)
Platelets: 335 10*3/uL (ref 150–400)
RBC: 4.18 MIL/uL (ref 3.87–5.11)
RDW: 12.4 % (ref 11.5–15.5)
WBC: 9.1 10*3/uL (ref 4.0–10.5)
nRBC: 0 % (ref 0.0–0.2)

## 2023-10-05 LAB — COMPREHENSIVE METABOLIC PANEL
ALT: 28 U/L (ref 0–44)
AST: 30 U/L (ref 15–41)
Albumin: 3.7 g/dL (ref 3.5–5.0)
Alkaline Phosphatase: 64 U/L (ref 38–126)
Anion gap: 11 (ref 5–15)
BUN: 15 mg/dL (ref 6–20)
CO2: 22 mmol/L (ref 22–32)
Calcium: 8.6 mg/dL — ABNORMAL LOW (ref 8.9–10.3)
Chloride: 101 mmol/L (ref 98–111)
Creatinine, Ser: 0.58 mg/dL (ref 0.44–1.00)
GFR, Estimated: >60 mL/min (ref >60)
Glucose, Bld: 109 mg/dL — ABNORMAL HIGH (ref 70–99)
Potassium: 3.4 mmol/L — ABNORMAL LOW (ref 3.5–5.1)
Sodium: 134 mmol/L — ABNORMAL LOW (ref 135–145)
Total Bilirubin: 0.5 mg/dL (ref <1.2)
Total Protein: 7.7 g/dL (ref 6.5–8.1)

## 2023-10-05 LAB — LIPOPROTEIN A (LPA): Lipoprotein (a): 42.1 nmol/L (ref <75.0)

## 2023-10-05 LAB — POCT PREGNANCY, URINE: Preg Test, Ur: NEGATIVE

## 2023-10-05 LAB — GLUCOSE, CAPILLARY: Glucose-Capillary: 107 mg/dL — ABNORMAL HIGH (ref 70–99)

## 2023-10-05 SURGERY — DILATATION & CURETTAGE/HYSTEROSCOPY WITH MYOSURE
Anesthesia: General | Site: Uterus

## 2023-10-05 MED ORDER — LACTATED RINGERS IV SOLN: INTRAVENOUS | Status: DC

## 2023-10-05 MED ORDER — DEXMEDETOMIDINE HCL IN NACL 80 MCG/20ML IV SOLN
INTRAVENOUS | Status: DC | PRN
  Administered 2023-10-05: 12 ug via INTRAVENOUS

## 2023-10-05 MED ORDER — STERILE WATER FOR IRRIGATION IR SOLN
Status: DC | PRN
Start: 2023-10-05 — End: 2023-10-05
  Administered 2023-10-05: 500 mL

## 2023-10-05 MED ORDER — MIDAZOLAM HCL 2 MG/2ML IJ SOLN
INTRAMUSCULAR | Status: AC
  Filled 2023-10-05: qty 2

## 2023-10-05 MED ORDER — POVIDONE-IODINE 10 % EX SWAB: 2.0000 | Freq: Once | CUTANEOUS | Status: DC

## 2023-10-05 MED ORDER — FENTANYL CITRATE (PF) 100 MCG/2ML IJ SOLN
INTRAMUSCULAR | Status: AC
  Filled 2023-10-05: qty 2

## 2023-10-05 MED ORDER — CEFAZOLIN IN SODIUM CHLORIDE 3-0.9 GM/100ML-% IV SOLN
3.0000 g | INTRAVENOUS | Status: AC
  Administered 2023-10-05: 3 g via INTRAVENOUS

## 2023-10-05 MED ORDER — DEXAMETHASONE SODIUM PHOSPHATE 10 MG/ML IJ SOLN
INTRAMUSCULAR | Status: DC | PRN
  Administered 2023-10-05: 10 mg via INTRAVENOUS

## 2023-10-05 MED ORDER — SOD CITRATE-CITRIC ACID 500-334 MG/5ML PO SOLN: 30.0000 mL | ORAL | Status: DC

## 2023-10-05 MED ORDER — SODIUM CHLORIDE 0.9 % IR SOLN
Status: DC | PRN
  Administered 2023-10-05: 3000 mL via INTRAVESICAL

## 2023-10-05 MED ORDER — LIDOCAINE HCL 1 % IJ SOLN
INTRAMUSCULAR | Status: DC | PRN
  Administered 2023-10-05: 20 mL

## 2023-10-05 MED ORDER — CEFAZOLIN SODIUM 1 G IJ SOLR
INTRAMUSCULAR | Status: AC
  Filled 2023-10-05: qty 10

## 2023-10-05 MED ORDER — SODIUM CHLORIDE 0.9 % IV SOLN: INTRAVENOUS | Status: DC | PRN

## 2023-10-05 MED ORDER — CEFAZOLIN SODIUM-DEXTROSE 2-4 GM/100ML-% IV SOLN
INTRAVENOUS | Status: AC
  Filled 2023-10-05: qty 100

## 2023-10-05 MED ORDER — PROPOFOL 10 MG/ML IV BOLUS
INTRAVENOUS | Status: AC
  Filled 2023-10-05: qty 20

## 2023-10-05 MED ORDER — FENTANYL CITRATE (PF) 250 MCG/5ML IJ SOLN
INTRAMUSCULAR | Status: DC | PRN
  Administered 2023-10-05: 25 ug via INTRAVENOUS
  Administered 2023-10-05: 50 ug via INTRAVENOUS
  Administered 2023-10-05: 25 ug via INTRAVENOUS

## 2023-10-05 MED ORDER — MIDAZOLAM HCL 2 MG/2ML IJ SOLN
INTRAMUSCULAR | Status: DC | PRN
  Administered 2023-10-05: 2 mg via INTRAVENOUS

## 2023-10-05 MED ORDER — KETOROLAC TROMETHAMINE 30 MG/ML IJ SOLN
INTRAMUSCULAR | Status: DC | PRN
  Administered 2023-10-05: 30 mg via INTRAVENOUS

## 2023-10-05 MED ORDER — LIDOCAINE 2% (20 MG/ML) 5 ML SYRINGE
INTRAMUSCULAR | Status: DC | PRN
  Administered 2023-10-05: 60 mg via INTRAVENOUS

## 2023-10-05 MED ORDER — ONDANSETRON HCL 4 MG/2ML IJ SOLN
INTRAMUSCULAR | Status: DC | PRN
  Administered 2023-10-05: 4 mg via INTRAVENOUS

## 2023-10-05 MED ORDER — PROPOFOL 10 MG/ML IV BOLUS
INTRAVENOUS | Status: DC | PRN
  Administered 2023-10-05: 300 mg via INTRAVENOUS

## 2023-10-05 MED ORDER — SODIUM CHLORIDE 0.9% FLUSH: 10.0000 mL | Freq: Two times a day (BID) | INTRAVENOUS | Status: DC

## 2023-10-05 SURGICAL SUPPLY — 25 items
CATH ROBINSON RED A/P 16FR (CATHETERS) ×1 IMPLANT
DEVICE MYOSURE LITE (MISCELLANEOUS) IMPLANT
DEVICE MYOSURE REACH (MISCELLANEOUS) IMPLANT
DILATOR CANAL MILEX (MISCELLANEOUS) IMPLANT
DRSG TELFA 3X8 NADH STRL (GAUZE/BANDAGES/DRESSINGS) ×1 IMPLANT
ELECT REM PT RETURN 9FT ADLT (ELECTROSURGICAL) IMPLANT
ELECTRODE REM PT RTRN 9FT ADLT (ELECTROSURGICAL) IMPLANT
GAUZE 4X4 16PLY ~~LOC~~+RFID DBL (SPONGE) ×1 IMPLANT
GLOVE BIO SURGEON STRL SZ8 (GLOVE) ×1 IMPLANT
GLOVE BIOGEL PI IND STRL 7.0 (GLOVE) IMPLANT
GLOVE SURG SS PI 7.0 STRL IVOR (GLOVE) IMPLANT
GLOVE SURG SS PI 7.5 STRL IVOR (GLOVE) IMPLANT
GOWN STRL REUS W/TWL LRG LVL3 (GOWN DISPOSABLE) IMPLANT
GOWN STRL REUS W/TWL XL LVL3 (GOWN DISPOSABLE) ×1 IMPLANT
IV NS IRRIG 3000ML ARTHROMATIC (IV SOLUTION) ×2 IMPLANT
KIT PROCEDURE FLUENT (KITS) ×1 IMPLANT
KIT TURNOVER CYSTO (KITS) ×1 IMPLANT
PACK VAGINAL MINOR WOMEN LF (CUSTOM PROCEDURE TRAY) ×1 IMPLANT
PAD OB MATERNITY 4.3X12.25 (PERSONAL CARE ITEMS) ×1 IMPLANT
PAD PREP 24X48 CUFFED NSTRL (MISCELLANEOUS) ×1 IMPLANT
SEAL CERVICAL OMNI LOK (ABLATOR) IMPLANT
SEAL ROD LENS SCOPE MYOSURE (ABLATOR) ×1 IMPLANT
SLEEVE SCD COMPRESS KNEE MED (STOCKING) ×1 IMPLANT
TOWEL OR 17X24 6PK STRL BLUE (TOWEL DISPOSABLE) ×1 IMPLANT
WATER STERILE IRR 500ML POUR (IV SOLUTION) IMPLANT

## 2023-10-05 NOTE — Op Note (Unsigned)
Courtney [name], Courtney [name] MEDICAL RECORD NO: [id number] ACCOUNT NO: [id number] DATE OF BIRTH: 08-18-1976 FACILITY: [facility] LOCATION: [facility] PHYSICIAN: Guy Sandifer. Arleta Creek, MD  Operative Report   DATE OF PROCEDURE: 10/05/2023   PREOPERATIVE DIAGNOSIS:  Menorrhagia.  POSTOPERATIVE DIAGNOSIS:  Menorrhagia.  PROCEDURE:  Hysteroscopy, dilation, curettage, and MyoSure resection.  SURGEON:  Harold Hedge, MD  ANESTHESIA:  General, Mal Amabile, MD  ESTIMATED BLOOD LOSS:  5 mL.   *** distending media 170 mL deficit.  INDICATIONS AND CONSENT:  This patient is a 47 year old patient with heavy menses.  Details dictated in the history and physical.  Hysteroscopy, dilation, curettage, and possible MyoSure has been discussed preoperatively.  Potential risks and  complications were discussed preoperatively including but not limited to infection, uterine perforation, organ damage, bleeding requiring transfusion of blood products with HIV and hepatitis acquisition, DVT, PE, and pneumonia.  The patient states she  understands and agrees and consent is signed on the chart.    FINDINGS:  Both fallopian tube ostia were identified.  There is an 8 mm bump in the middle of the posterior endometrial cavity.   DESCRIPTION OF PROCEDURE:  The patient was taken to the operating room where she was identified, placed in the dorsal supine position, and general anesthesia was induced.  She was placed in the dorsal lithotomy position. Timeout was done.  She was  prepped with Betadine and draped in a sterile fashion.  Bivalve speculum was placed. Anterior cervix was injected with 1% lidocaine and grasped with a single-tooth tenaculum.  Paracervical block was placed at the 2, 4, 5, 7, 8, and 10 o'clock positions  with approximately 20 mL of the same solution.  Cervix was gently progressively dilated.  Hysteroscope was placed in the endocervical canal and advanced under direct visualization using distending media.  The  above findings were noted.  MyoSure was then  used to resect the masses in the endometrial cavity.  Hysteroscope was removed.  Sharp curettage was done.  Reinspection reveals the cavity to be cleaned.  There is excellent distention of the cavity before and after curettage.  All counts were correct.  The patient is awakened and taken to the recovery room in stable condition.     [email or web address] D: 10/05/2023 8:13:02 am T: 10/05/2023 8:25:00 am  JOB: [id number]/ [id number]

## 2023-10-05 NOTE — Transfer of Care (Signed)
Immediate Anesthesia Transfer of Care Note  Patient: Courtney [name]  Procedure(s) Performed: DILATATION & CURETTAGE/HYSTEROSCOPY WITH POSSIBLE MYOSURE (Uterus)  Patient Location: PACU  Anesthesia Type:General  Level of Consciousness: drowsy  Airway & Oxygen Therapy: Patient Spontanous Breathing  Post-op Assessment: Report given to RN and Post -op Vital signs reviewed and stable  Post vital signs: Reviewed and stable  Last Vitals:  Vitals Value Taken Time  BP 121/57 10/05/23 0815  Temp    Pulse 100 10/05/23 0815  Resp 19 10/05/23 0815  SpO2 94 % 10/05/23 0815  Vitals shown include unfiled device data.  Last Pain:  Vitals:   10/05/23 0622  TempSrc: Oral  PainSc: 0-No pain      Patients Stated Pain Goal: 7 (10/05/23 0622)  Complications: No notable events documented.

## 2023-10-05 NOTE — Progress Notes (Signed)
10/05/2023  8:08 AM  PATIENT:  Courtney [name]  47 y.o. female  PRE-OPERATIVE DIAGNOSIS:  MENORRHAGIA  POST-OPERATIVE DIAGNOSIS:  MENORRHAGIA  PROCEDURE:  Procedure(s): DILATATION & CURETTAGE/HYSTEROSCOPY WITH POSSIBLE MYOSURE (N/A)  SURGEON:  Surgeons and Role:    * Harold Hedge, MD - Primary  PHYSICIAN ASSISTANT:   ASSISTANTS: none   ANESTHESIA:   general  EBL:  5 mL   BLOOD ADMINISTERED:none  DRAINS: none   LOCAL MEDICATIONS USED:  LIDOCAINE  and Amount: 20 ml  SPECIMEN:  Source of Specimen:  endometrial resection, endometrial curetting  DISPOSITION OF SPECIMEN:  PATHOLOGY  COUNTS:  YES  TOURNIQUET:  * No tourniquets in log *  DICTATION: .Other Dictation: Dictation Number [id number]  PLAN OF CARE: Discharge to home after PACU  PATIENT DISPOSITION:  PACU - hemodynamically stable.   Delay start of Pharmacological VTE agent (>24hrs) due to surgical blood loss or risk of bleeding: not applicable

## 2023-10-05 NOTE — Anesthesia Postprocedure Evaluation (Signed)
Anesthesia Post Note  Patient: Courtney [name]  Procedure(s) Performed: DILATATION & CURETTAGE/HYSTEROSCOPY WITH POSSIBLE MYOSURE (Uterus)     Patient location during evaluation: PACU Anesthesia Type: General Level of consciousness: awake and alert and oriented Pain management: pain level controlled Vital Signs Assessment: post-procedure vital signs reviewed and stable Respiratory status: spontaneous breathing, nonlabored ventilation and respiratory function stable Cardiovascular status: blood pressure returned to baseline and stable Postop Assessment: no apparent nausea or vomiting Anesthetic complications: no   No notable events documented.  Last Vitals:  Vitals:   10/05/23 0815 10/05/23 0845  BP: (!) 121/57 108/60  Pulse: 100 82  Resp: 19 18  Temp: 36.5 C   SpO2: 94% 92%    Last Pain:  Vitals:   10/05/23 0845  TempSrc:   PainSc: Asleep                 Jeffrey Graefe A.

## 2023-10-05 NOTE — Anesthesia Procedure Notes (Signed)
Procedure Name: LMA Insertion Date/Time: 10/05/2023 7:37 AM  Performed by: Dairl Ponder, CRNAPre-anesthesia Checklist: Patient identified, Emergency Drugs available, Suction available and Patient being monitored Patient Re-evaluated:Patient Re-evaluated prior to induction Oxygen Delivery Method: Circle System Utilized Preoxygenation: Pre-oxygenation with 100% oxygen Induction Type: IV induction Ventilation: Mask ventilation without difficulty LMA: LMA inserted LMA Size: 4.0 Number of attempts: 1 Airway Equipment and Method: Bite block Placement Confirmation: positive ETCO2 Tube secured with: Tape Dental Injury: Teeth and Oropharynx as per pre-operative assessment

## 2023-10-05 NOTE — Discharge Instructions (Signed)
Do NOT take Motrin until after 2pm.   Post Anesthesia Home Care Instructions  Activity: Get plenty of rest for the remainder of the day. A responsible adult should stay with you for 24 hours following the procedure.  For the next 24 hours, DO NOT: -Drive a car -Advertising copywriter -Drink alcoholic beverages -Take any medication unless instructed by your physician -Make any legal decisions or sign important papers.  Meals: Start with liquid foods such as gelatin or soup. Progress to regular foods as tolerated. Avoid greasy, spicy, heavy foods. If nausea and/or vomiting occur, drink only clear liquids until the nausea and/or vomiting subsides. Call your physician if vomiting continues.  Special Instructions/Symptoms: Your throat may feel dry or sore from the anesthesia or the breathing tube placed in your throat during surgery. If this causes discomfort, gargle with warm salt water. The discomfort should disappear within 24 hours.

## 2023-10-05 NOTE — Progress Notes (Signed)
No change to H&P per patient history D/W procedure-hysteroscopy, dilation and curettage, possible Myosure All questions answered  She states she understands and agrees NKDA

## 2023-10-06 ENCOUNTER — Encounter (HOSPITAL_BASED_OUTPATIENT_CLINIC_OR_DEPARTMENT_OTHER): Payer: Self-pay | Admitting: Obstetrics and Gynecology

## 2023-10-06 LAB — SURGICAL PATHOLOGY

## 2023-11-11 ENCOUNTER — Ambulatory Visit (HOSPITAL_COMMUNITY): Payer: 59

## 2023-11-20 ENCOUNTER — Ambulatory Visit (HOSPITAL_COMMUNITY)
Admission: RE | Admit: 2023-11-20 | Discharge: 2023-11-20 | Disposition: A | Discharge status: Home / Self Care | Payer: Self-pay | Source: Ambulatory Visit | Attending: Internal Medicine | Admitting: Internal Medicine

## 2023-11-20 DIAGNOSIS — Z136 Encounter for screening for cardiovascular disorders: Secondary | ICD-10-CM | POA: Insufficient documentation

## 2023-11-23 ENCOUNTER — Telehealth: Payer: Self-pay | Admitting: Internal Medicine

## 2023-11-23 NOTE — Telephone Encounter (Signed)
-----   Message from Vishnu P Mallipeddi sent at 11/23/2023  9:44 AM EST ----- Coronary calcium score is 3.81 (93rd percentile for age and sex matched control), already on statin.  Will continue.  CT lung showed patchy and nodular airspace disease in the left lower lobe suspicious for infection, follow with PCP to rule out any pneumonia.

## 2023-11-23 NOTE — Telephone Encounter (Signed)
Ray with [facility] called to report critical CT results

## 2023-11-23 NOTE — Telephone Encounter (Signed)
Per Mallipeddi, "She has lung infection. I am not sure of her symptoms. If severe, needs to go to ER."

## 2023-11-23 NOTE — Telephone Encounter (Signed)
Received critical report for Coronary Calcium Score that showed patchy nodular air space disease in left lower lobe suspicious for infection. Result available in chart and will route to Mallipeddi.

## 2023-11-23 NOTE — Telephone Encounter (Signed)
Patient informed and verbalized understanding of plan. Copy sent to PCP

## 2023-11-25 ENCOUNTER — Other Ambulatory Visit (HOSPITAL_COMMUNITY): Payer: Self-pay | Admitting: Family Medicine

## 2023-11-25 DIAGNOSIS — A493 Mycoplasma infection, unspecified site: Secondary | ICD-10-CM | POA: Diagnosis not present

## 2023-11-25 DIAGNOSIS — R9389 Abnormal findings on diagnostic imaging of other specified body structures: Secondary | ICD-10-CM | POA: Diagnosis not present

## 2023-11-25 DIAGNOSIS — Z6841 Body Mass Index (BMI) 40.0 and over, adult: Secondary | ICD-10-CM | POA: Diagnosis not present

## 2023-12-23 ENCOUNTER — Ambulatory Visit (HOSPITAL_COMMUNITY)
Admission: RE | Admit: 2023-12-23 | Discharge: 2023-12-23 | Disposition: A | Discharge status: Home / Self Care | Payer: 59 | Source: Ambulatory Visit | Attending: Family Medicine | Admitting: Family Medicine

## 2023-12-23 DIAGNOSIS — R9389 Abnormal findings on diagnostic imaging of other specified body structures: Secondary | ICD-10-CM | POA: Diagnosis not present

## 2023-12-23 DIAGNOSIS — R918 Other nonspecific abnormal finding of lung field: Secondary | ICD-10-CM | POA: Diagnosis not present

## 2024-01-18 DIAGNOSIS — Z124 Encounter for screening for malignant neoplasm of cervix: Secondary | ICD-10-CM | POA: Diagnosis not present

## 2024-03-02 DIAGNOSIS — A493 Mycoplasma infection, unspecified site: Secondary | ICD-10-CM | POA: Diagnosis not present

## 2024-12-01 NOTE — Progress Notes (Signed)
 Courtney [name]                                          MRN: [id number]   12/01/2024   The [facility] Quality Team Specialist reviewed this patient medical record for the purposes of chart review for care gap closure. The following were reviewed: chart review for care gap closure-controlling blood pressure.    [facility] Quality Team
